# Patient Record
Sex: Female | Born: 1995 | Race: Black or African American | Hispanic: No | Marital: Single | Smoking: Former smoker
Health system: Southern US, Community
[De-identification: ages and names within clinical notes are randomized; demographics above are authoritative.]

## PROBLEM LIST (undated history)

## (undated) ENCOUNTER — Inpatient Hospital Stay (HOSPITAL_COMMUNITY): Payer: Self-pay

## (undated) DIAGNOSIS — G8929 Other chronic pain: Secondary | ICD-10-CM

## (undated) DIAGNOSIS — J45909 Unspecified asthma, uncomplicated: Secondary | ICD-10-CM

## (undated) DIAGNOSIS — Z302 Encounter for sterilization: Secondary | ICD-10-CM

## (undated) DIAGNOSIS — K6289 Other specified diseases of anus and rectum: Secondary | ICD-10-CM

## (undated) DIAGNOSIS — B999 Unspecified infectious disease: Secondary | ICD-10-CM

## (undated) DIAGNOSIS — G43909 Migraine, unspecified, not intractable, without status migrainosus: Secondary | ICD-10-CM

## (undated) DIAGNOSIS — Z789 Other specified health status: Secondary | ICD-10-CM

## (undated) DIAGNOSIS — Z9851 Tubal ligation status: Secondary | ICD-10-CM

## (undated) HISTORY — PX: TUBAL LIGATION: SHX77

## (undated) HISTORY — PX: DILATION AND CURETTAGE OF UTERUS: SHX78

## (undated) HISTORY — PX: TOOTH EXTRACTION: SUR596

## (undated) HISTORY — PX: NO PAST SURGERIES: SHX2092

---
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
## 2014-03-17 ENCOUNTER — Emergency Department (HOSPITAL_COMMUNITY)
Admission: EM | Admit: 2014-03-17 | Discharge: 2014-03-18 | Disposition: A | Discharge status: Home / Self Care | Payer: Medicaid Other | Attending: Emergency Medicine | Admitting: Emergency Medicine

## 2014-03-17 DIAGNOSIS — F17200 Nicotine dependence, unspecified, uncomplicated: Secondary | ICD-10-CM | POA: Insufficient documentation

## 2014-03-17 DIAGNOSIS — Z3202 Encounter for pregnancy test, result negative: Secondary | ICD-10-CM | POA: Insufficient documentation

## 2014-03-17 DIAGNOSIS — G43909 Migraine, unspecified, not intractable, without status migrainosus: Secondary | ICD-10-CM | POA: Insufficient documentation

## 2014-03-17 HISTORY — DX: Migraine, unspecified, not intractable, without status migrainosus: G43.909

## 2014-03-18 ENCOUNTER — Encounter (HOSPITAL_COMMUNITY): Payer: Self-pay | Admitting: Emergency Medicine

## 2014-03-18 LAB — PREGNANCY, URINE: Preg Test, Ur: NEGATIVE

## 2014-03-18 MED ORDER — DIPHENHYDRAMINE HCL 25 MG PO CAPS
25.0000 mg | ORAL_CAPSULE | Freq: Once | ORAL | Status: AC
  Administered 2014-03-18: 25 mg via ORAL
  Filled 2014-03-18: qty 1

## 2014-03-18 MED ORDER — KETOROLAC TROMETHAMINE 60 MG/2ML IM SOLN
60.0000 mg | Freq: Once | INTRAMUSCULAR | Status: AC
  Administered 2014-03-18: 60 mg via INTRAMUSCULAR
  Filled 2014-03-18: qty 2

## 2014-03-18 MED ORDER — METOCLOPRAMIDE HCL 5 MG/ML IJ SOLN
10.0000 mg | Freq: Once | INTRAMUSCULAR | Status: AC
  Administered 2014-03-18: 10 mg via INTRAMUSCULAR
  Filled 2014-03-18: qty 2

## 2014-03-18 MED ORDER — METOCLOPRAMIDE HCL 10 MG PO TABS: 10.0000 mg | ORAL_TABLET | Freq: Four times a day (QID) | ORAL | Status: DC

## 2014-03-18 NOTE — ED Notes (Signed)
Headache for 2 days above eyes,  States not a migraine and denies nausea or vision problems only states eyes are sore.  Pt is alert and oriented in NAD

## 2014-03-18 NOTE — ED Provider Notes (Signed)
CSN: [id number]     Arrival date & time 03/17/14  2352 History   First MD Initiated Contact with Patient 03/18/14 0014     Chief Complaint  Patient presents with  . Headache     (Consider location/radiation/quality/duration/timing/severity/associated sxs/prior Treatment) HPI Comments: 18 year old female with migraine history and smoking history presents with gradual onset headache for the past 2 days. I reviewed the nurses note and clarified with the patient this is slightly different than her migraines however overall has many similarities. The headache is moved from bilateral frontal behind the eyes left-sided. No injuries or history of CNS problems. No fevers or neck stiffness. No other neuro symptoms. Patient feels well otherwise. Patient tried 200 mg of ibuprofen with only mild improvement. Patient feels likely the heat and allergy symptoms have brought on her symptoms.  Patient is a 18 y.o. female presenting with headaches. The history is provided by the patient.  Headache Associated symptoms: photophobia   Associated symptoms: no abdominal pain, no back pain, no fever, no neck pain, no neck stiffness, no numbness and no vomiting     Past Medical History  Diagnosis Date  . No pertinent past medical history   . Migraines    Past Surgical History  Procedure Laterality Date  . No past surgeries     Family History  Problem Relation Age of Onset  . Hypertension Mother   . Hypertension Father   . Diabetes Maternal Aunt   . Hypertension Maternal Aunt   . Diabetes Maternal Uncle   . Cancer Maternal Grandmother   . Hypertension Maternal Grandmother   . Cancer Maternal Grandfather     colon  . Hypertension Maternal Grandfather    History  Substance Use Topics  . Smoking status: Current Every Day Smoker  . Smokeless tobacco: Never Used  . Alcohol Use: No   OB History   Grav Para Term Preterm Abortions TAB SAB Ect Mult Living   1 1 1  0 0 0 0 0 0 1     Review of Systems   Constitutional: Negative for fever and chills.  Eyes: Positive for photophobia. Negative for visual disturbance.  Respiratory: Negative for shortness of breath.   Cardiovascular: Negative for chest pain.  Gastrointestinal: Negative for vomiting and abdominal pain.  Genitourinary: Negative for dysuria and flank pain.  Musculoskeletal: Negative for back pain, neck pain and neck stiffness.  Skin: Negative for rash.  Neurological: Positive for headaches. Negative for weakness, light-headedness and numbness.      Allergies  Penicillins cross reactors and Pineapple  Home Medications   Prior to Admission medications   Not on File   BP 142/85  Pulse 84  Temp(Src) 98.6 F (37 C) (Oral)  Resp 16  SpO2 99% Physical Exam  Nursing note and vitals reviewed. Constitutional: She is oriented to person, place, and time. She appears well-developed and well-nourished.  HENT:  Head: Normocephalic and atraumatic.  Eyes: Conjunctivae are normal. Right eye exhibits no discharge. Left eye exhibits no discharge.  Neck: Normal range of motion. Neck supple. No tracheal deviation present.  Cardiovascular: Normal rate.   Pulmonary/Chest: Effort normal.  Abdominal: Soft.  Musculoskeletal: She exhibits no edema.  Neurological: She is alert and oriented to person, place, and time. Coordination normal. GCS eye subscore is 4. GCS verbal subscore is 5. GCS motor subscore is 6.  5+ strength in UE and LE with f/e at major joints. Sensation to palpation intact in UE and LE. CNs 2-12 grossly intact.  EOMFI.  PERRL.   Finger nose and coordination intact bilateral.   Visual fields intact to finger testing.   Skin: Skin is warm. No rash noted.  Psychiatric: She has a normal mood and affect.    ED Course  Procedures (including critical care time) Labs Review Labs Reviewed  PREGNANCY, URINE    Imaging Review No results found.   EKG Interpretation None      MDM   Final diagnoses:  Migraine    Well-appearing female with mild symptoms overall similar to her previous migraine. Plan for oral fluids, migraine cocktail and urine pregnancy test. The headache is gradual onset, no signs of meningitis and no significant medical history. On recheck patient well-appearing, headache improved significantly.   Results and differential diagnosis were discussed with the patient. Close follow up outpatient was discussed, patient comfortable with the plan.   Filed Vitals:   03/17/14 2357  BP: 142/85  Pulse: 84  Temp: 98.6 F (37 C)  TempSrc: Oral  Resp: 16  SpO2: 99%         Mariea Clonts, MD 03/18/14 [phone]

## 2014-03-18 NOTE — Discharge Instructions (Signed)
For headaches that are similar to migraines she can try Reglan with oral Benadryl. Stay well-hydrated and followup with a local doctor headaches get worse or more recurrent. Return to the ER for headaches that are sudden onset, severe at onset, fevers associated or other neuro symptoms.   Headaches, Frequently Asked Questions MIGRAINE HEADACHES Q: What is migraine? What causes it? How can I treat it? A: Generally, migraine headaches begin as a dull ache. Then they develop into a constant, throbbing, and pulsating pain. You may experience pain at the temples. You may experience pain at the front or back of one or both sides of the head. The pain is usually accompanied by a combination of:  Nausea.  Vomiting.  Sensitivity to light and noise. Some people (about 15%) experience an aura (see below) before an attack. The cause of migraine is believed to be chemical reactions in the brain. Treatment for migraine may include over-the-counter or prescription medications. It may also include self-help techniques. These include relaxation training and biofeedback.  Q: What is an aura? A: About 15% of people with migraine get an "aura". This is a sign of neurological symptoms that occur before a migraine headache. You may see wavy or jagged lines, dots, or flashing lights. You might experience tunnel vision or blind spots in one or both eyes. The aura can include visual or auditory hallucinations (something imagined). It may include disruptions in smell (such as strange odors), taste or touch. Other symptoms include:  Numbness.  A "pins and needles" sensation.  Difficulty in recalling or speaking the correct word. These neurological events may last as long as 60 minutes. These symptoms will fade as the headache begins. Q: What is a trigger? A: Certain physical or environmental factors can lead to or "trigger" a migraine. These include:  Foods.  Hormonal changes.  Weather.  Stress. It is  important to remember that triggers are different for everyone. To help prevent migraine attacks, you need to figure out which triggers affect you. Keep a headache diary. This is a good way to track triggers. The diary will help you talk to your healthcare professional about your condition. Q: Does weather affect migraines? A: Bright sunshine, hot, humid conditions, and drastic changes in barometric pressure may lead to, or "trigger," a migraine attack in some people. But studies have shown that weather does not act as a trigger for everyone with migraines. Q: What is the link between migraine and hormones? A: Hormones start and regulate many of your body's functions. Hormones keep your body in balance within a constantly changing environment. The levels of hormones in your body are unbalanced at times. Examples are during menstruation, pregnancy, or menopause. That can lead to a migraine attack. In fact, about three quarters of all women with migraine report that their attacks are related to the menstrual cycle.  Q: Is there an increased risk of stroke for migraine sufferers? A: The likelihood of a migraine attack causing a stroke is very remote. That is not to say that migraine sufferers cannot have a stroke associated with their migraines. In persons under age 24, the most common associated factor for stroke is migraine headache. But over the course of a person's normal life span, the occurrence of migraine headache may actually be associated with a reduced risk of dying from cerebrovascular disease due to stroke.  Q: What are acute medications for migraine? A: Acute medications are used to treat the pain of the headache after it has started. Examples  over-the-counter medications, NSAIDs, ergots, and triptans.  Q: What are the triptans? A: Triptans are the newest class of abortive medications. They are specifically targeted to treat migraine. Triptans are vasoconstrictors. They moderate some chemical  reactions in the brain. The triptans work on receptors in your brain. Triptans help to restore the balance of a neurotransmitter called serotonin. Fluctuations in levels of serotonin are thought to be a main cause of migraine.  Q: Are over-the-counter medications for migraine effective? A: Over-the-counter, or "OTC," medications may be effective in relieving mild to moderate pain and associated symptoms of migraine. But you should see your caregiver before beginning any treatment regimen for migraine.  Q: What are preventive medications for migraine? A: Preventive medications for migraine are sometimes referred to as "prophylactic" treatments. They are used to reduce the frequency, severity, and length of migraine attacks. Examples of preventive medications include antiepileptic medications, antidepressants, beta-blockers, calcium channel blockers, and NSAIDs (nonsteroidal anti-inflammatory drugs). Q: Why are anticonvulsants used to treat migraine? A: During the past few years, there has been an increased interest in antiepileptic drugs for the prevention of migraine. They are sometimes referred to as "anticonvulsants". Both epilepsy and migraine may be caused by similar reactions in the brain.  Q: Why are antidepressants used to treat migraine? A: Antidepressants are typically used to treat people with depression. They may reduce migraine frequency by regulating chemical levels, such as serotonin, in the brain.  Q: What alternative therapies are used to treat migraine? A: The term "alternative therapies" is often used to describe treatments considered outside the scope of conventional Western medicine. Examples of alternative therapy include acupuncture, acupressure, and yoga. Another common alternative treatment is herbal therapy. Some herbs are believed to relieve headache pain. Always discuss alternative therapies with your caregiver before proceeding. Some herbal products contain arsenic and other  toxins. TENSION HEADACHES Q: What is a tension-type headache? What causes it? How can I treat it? A: Tension-type headaches occur randomly. They are often the result of temporary stress, anxiety, fatigue, or anger. Symptoms include soreness in your temples, a tightening band-like sensation around your head (a "vice-like" ache). Symptoms can also include a pulling feeling, pressure sensations, and contracting head and neck muscles. The headache begins in your forehead, temples, or the back of your head and neck. Treatment for tension-type headache may include over-the-counter or prescription medications. Treatment may also include self-help techniques such as relaxation training and biofeedback. CLUSTER HEADACHES Q: What is a cluster headache? What causes it? How can I treat it? A: Cluster headache gets its name because the attacks come in groups. The pain arrives with little, if any, warning. It is usually on one side of the head. A tearing or bloodshot eye and a runny nose on the same side of the headache may also accompany the pain. Cluster headaches are believed to be caused by chemical reactions in the brain. They have been described as the most severe and intense of any headache type. Treatment for cluster headache includes prescription medication and oxygen. SINUS HEADACHES Q: What is a sinus headache? What causes it? How can I treat it? A: When a cavity in the bones of the face and skull (a sinus) becomes inflamed, the inflammation will cause localized pain. This condition is usually the result of an allergic reaction, a tumor, or an infection. If your headache is caused by a sinus blockage, such as an infection, you will probably have a fever. An x-ray will confirm a sinus blockage. Your caregiver's  treatment might include antibiotics for the infection, as well as antihistamines or decongestants.  REBOUND HEADACHES Q: What is a rebound headache? What causes it? How can I treat it? A: A pattern of  taking acute headache medications too often can lead to a condition known as "rebound headache." A pattern of taking too much headache medication includes taking it more than 2 days per week or in excessive amounts. That means more than the label or a caregiver advises. With rebound headaches, your medications not only stop relieving pain, they actually begin to cause headaches. Doctors treat rebound headache by tapering the medication that is being overused. Sometimes your caregiver will gradually substitute a different type of treatment or medication. Stopping may be a challenge. Regularly overusing a medication increases the potential for serious side effects. Consult a caregiver if you regularly use headache medications more than 2 days per week or more than the label advises. ADDITIONAL QUESTIONS AND ANSWERS Q: What is biofeedback? A: Biofeedback is a self-help treatment. Biofeedback uses special equipment to monitor your body's involuntary physical responses. Biofeedback monitors:  Breathing.  Pulse.  Heart rate.  Temperature.  Muscle tension.  Brain activity. Biofeedback helps you refine and perfect your relaxation exercises. You learn to control the physical responses that are related to stress. Once the technique has been mastered, you do not need the equipment any more. Q: Are headaches hereditary? A: Four out of five (80%) of people that suffer report a family history of migraine. Scientists are not sure if this is genetic or a family predisposition. Despite the uncertainty, a child has a 50% chance of having migraine if one parent suffers. The child has a 75% chance if both parents suffer.  Q: Can children get headaches? A: By the time they reach high school, most young people have experienced some type of headache. Many safe and effective approaches or medications can prevent a headache from occurring or stop it after it has begun.  Q: What type of doctor should I see to diagnose  and treat my headache? A: Start with your primary caregiver. Discuss his or her experience and approach to headaches. Discuss methods of classification, diagnosis, and treatment. Your caregiver may decide to recommend you to a headache specialist, depending upon your symptoms or other physical conditions. Having diabetes, allergies, etc., may require a more comprehensive and inclusive approach to your headache. The National Headache Foundation will provide, upon request, a list of [facility] physician members in your state. Document Released: 01/16/2004 Document Revised: 01/18/2012 Document Reviewed: 06/25/2008 [facility] Patient Information 2014 [place].

## 2014-03-18 NOTE — ED Notes (Signed)
Patient is alert and oriented x3.  She is complaining of a headache that has lasted for 2 days. Patient states that she does have a history of migraines but this doesn't feel like one.  Currently  She rates her pain 8 of 10 without nausea.

## 2014-07-13 ENCOUNTER — Encounter (HOSPITAL_COMMUNITY): Payer: Self-pay | Admitting: Emergency Medicine

## 2014-07-13 ENCOUNTER — Emergency Department (INDEPENDENT_AMBULATORY_CARE_PROVIDER_SITE_OTHER)
Admission: EM | Admit: 2014-07-13 | Discharge: 2014-07-13 | Disposition: A | Discharge status: Home / Self Care | Payer: Medicaid Other | Source: Home / Self Care | Attending: Emergency Medicine | Admitting: Emergency Medicine

## 2014-07-13 DIAGNOSIS — Z3201 Encounter for pregnancy test, result positive: Secondary | ICD-10-CM

## 2014-07-13 DIAGNOSIS — J300 Vasomotor rhinitis: Secondary | ICD-10-CM

## 2014-07-13 DIAGNOSIS — Z3491 Encounter for supervision of normal pregnancy, unspecified, first trimester: Secondary | ICD-10-CM

## 2014-07-13 DIAGNOSIS — J309 Allergic rhinitis, unspecified: Secondary | ICD-10-CM

## 2014-07-13 LAB — POCT PREGNANCY, URINE: Preg Test, Ur: POSITIVE — AB

## 2014-07-13 MED ORDER — PRENATAL VITAMINS PLUS 27-1 MG PO TABS: ORAL_TABLET | ORAL | Status: DC

## 2014-07-13 NOTE — Discharge Instructions (Signed)
May use antihistamines (Allegra, Zyrtec, Claritin), saline nose spray, inhale steam, moist, warm compresses to face.  First Trimester of Pregnancy The first trimester of pregnancy is from week 1 until the end of week 12 (months 1 through 3). A week after a sperm fertilizes an egg, the egg will implant on the wall of the uterus. This embryo will begin to develop into a baby. Genes from you and your partner are forming the baby. The female genes determine whether the baby is a boy or a girl. At 6-8 weeks, the eyes and face are formed, and the heartbeat can be seen on ultrasound. At the end of 12 weeks, all the baby's organs are formed.  Now that you are pregnant, you will want to do everything you can to have a healthy baby. Two of the most important things are to get good prenatal care and to follow your health care provider's instructions. Prenatal care is all the medical care you receive before the baby's birth. This care will help prevent, find, and treat any problems during the pregnancy and childbirth. BODY CHANGES Your body goes through many changes during pregnancy. The changes vary from woman to woman.   You may gain or lose a couple of pounds at first.  You may feel sick to your stomach (nauseous) and throw up (vomit). If the vomiting is uncontrollable, call your health care provider.  You may tire easily.  You may develop headaches that can be relieved by medicines approved by your health care provider.  You may urinate more often. Painful urination may mean you have a bladder infection.  You may develop heartburn as a result of your pregnancy.  You may develop constipation because certain hormones are causing the muscles that push waste through your intestines to slow down.  You may develop hemorrhoids or swollen, bulging veins (varicose veins).  Your breasts may begin to grow larger and become tender. Your nipples may stick out more, and the tissue that surrounds them (areola) may  become darker.  Your gums may bleed and may be sensitive to brushing and flossing.  Dark spots or blotches (chloasma, mask of pregnancy) may develop on your face. This will likely fade after the baby is born.  Your menstrual periods will stop.  You may have a loss of appetite.  You may develop cravings for certain kinds of food.  You may have changes in your emotions from day to day, such as being excited to be pregnant or being concerned that something may go wrong with the pregnancy and baby.  You may have more vivid and strange dreams.  You may have changes in your hair. These can include thickening of your hair, rapid growth, and changes in texture. Some women also have hair loss during or after pregnancy, or hair that feels dry or thin. Your hair will most likely return to normal after your baby is born. WHAT TO EXPECT AT YOUR PRENATAL VISITS During a routine prenatal visit:  You will be weighed to make sure you and the baby are growing normally.  Your blood pressure will be taken.  Your abdomen will be measured to track your baby's growth.  The fetal heartbeat will be listened to starting around week 10 or 12 of your pregnancy.  Test results from any previous visits will be discussed. Your health care provider may ask you:  How you are feeling.  If you are feeling the baby move.  If you have had any abnormal symptoms, such  as leaking fluid, bleeding, severe headaches, or abdominal cramping.  If you have any questions. Other tests that may be performed during your first trimester include:  Blood tests to find your blood type and to check for the presence of any previous infections. They will also be used to check for low iron levels (anemia) and Rh antibodies. Later in the pregnancy, blood tests for diabetes will be done along with other tests if problems develop.  Urine tests to check for infections, diabetes, or protein in the urine.  An ultrasound to confirm the  proper growth and development of the baby.  An amniocentesis to check for possible genetic problems.  Fetal screens for spina bifida and Down syndrome.  You may need other tests to make sure you and the baby are doing well. HOME CARE INSTRUCTIONS  Medicines  Follow your health care provider's instructions regarding medicine use. Specific medicines may be either safe or unsafe to take during pregnancy.  Take your prenatal vitamins as directed.  If you develop constipation, try taking a stool softener if your health care provider approves. Diet  Eat regular, well-balanced meals. Choose a variety of foods, such as meat or vegetable-based protein, fish, milk and low-fat dairy products, vegetables, fruits, and whole grain breads and cereals. Your health care provider will help you determine the amount of weight gain that is right for you.  Avoid raw meat and uncooked cheese. These carry germs that can cause birth defects in the baby.  Eating four or five small meals rather than three large meals a day may help relieve nausea and vomiting. If you start to feel nauseous, eating a few soda crackers can be helpful. Drinking liquids between meals instead of during meals also seems to help nausea and vomiting.  If you develop constipation, eat more high-fiber foods, such as fresh vegetables or fruit and whole grains. Drink enough fluids to keep your urine clear or pale yellow. Activity and Exercise  Exercise only as directed by your health care provider. Exercising will help you:  Control your weight.  Stay in shape.  Be prepared for labor and delivery.  Experiencing pain or cramping in the lower abdomen or low back is a good sign that you should stop exercising. Check with your health care provider before continuing normal exercises.  Try to avoid standing for long periods of time. Move your legs often if you must stand in one place for a long time.  Avoid heavy lifting.  Wear  low-heeled shoes, and practice good posture.  You may continue to have sex unless your health care provider directs you otherwise. Relief of Pain or Discomfort  Wear a good support bra for breast tenderness.   Take warm sitz baths to soothe any pain or discomfort caused by hemorrhoids. Use hemorrhoid cream if your health care provider approves.   Rest with your legs elevated if you have leg cramps or low back pain.  If you develop varicose veins in your legs, wear support hose. Elevate your feet for 15 minutes, 3-4 times a day. Limit salt in your diet. Prenatal Care  Schedule your prenatal visits by the twelfth week of pregnancy. They are usually scheduled monthly at first, then more often in the last 2 months before delivery.  Write down your questions. Take them to your prenatal visits.  Keep all your prenatal visits as directed by your health care provider. Safety  Wear your seat belt at all times when driving.  Make a list of  emergency phone numbers, including numbers for family, friends, the hospital, and police and fire departments. General Tips  Ask your health care provider for a referral to a local prenatal education class. Begin classes no later than at the beginning of month 6 of your pregnancy.  Ask for help if you have counseling or nutritional needs during pregnancy. Your health care provider can offer advice or refer you to specialists for help with various needs.  Do not use hot tubs, steam rooms, or saunas.  Do not douche or use tampons or scented sanitary pads.  Do not cross your legs for long periods of time.  Avoid cat litter boxes and soil used by cats. These carry germs that can cause birth defects in the baby and possibly loss of the fetus by miscarriage or stillbirth.  Avoid all smoking, herbs, alcohol, and medicines not prescribed by your health care provider. Chemicals in these affect the formation and growth of the baby.  Schedule a dentist  appointment. At home, brush your teeth with a soft toothbrush and be gentle when you floss. SEEK MEDICAL CARE IF:   You have dizziness.  You have mild pelvic cramps, pelvic pressure, or nagging pain in the abdominal area.  You have persistent nausea, vomiting, or diarrhea.  You have a bad smelling vaginal discharge.  You have pain with urination.  You notice increased swelling in your face, hands, legs, or ankles. SEEK IMMEDIATE MEDICAL CARE IF:   You have a fever.  You are leaking fluid from your vagina.  You have spotting or bleeding from your vagina.  You have severe abdominal cramping or pain.  You have rapid weight gain or loss.  You vomit blood or material that looks like coffee grounds.  You are exposed to [place] measles and have never had them.  You are exposed to fifth disease or chickenpox.  You develop a severe headache.  You have shortness of breath.  You have any kind of trauma, such as from a fall or a car accident. Document Released: 10/20/2001 Document Revised: 03/12/2014 Document Reviewed: 09/05/2013 [facility] Patient Information 2015 [place], [place]. This information is not intended to replace advice given to you by your health care provider. Make sure you discuss any questions you have with your health care provider.

## 2014-07-13 NOTE — ED Notes (Signed)
C/o URI type symptoms since last week. No relief w OTC medications

## 2014-07-13 NOTE — ED Provider Notes (Signed)
  Chief Complaint   Chief Complaint  Patient presents with  . URI    History of Present Illness   Raven Roberts is a 18 year old female who has had a one-week history of nasal congestion with clear rhinorrhea, sneezing, watery eyes, and slight headache. She has a history of allergies in the past. She denies any fever, chills, earache, sore throat, or cough. The patient's last menstrual period was August 7. She had a pregnancy test done yesterday at home which was positive. This would be her second pregnancy.  Review of Systems   Other than as noted above, the patient denies any of the following symptoms: Systemic:  No fevers or chills. Eye:  No redness, pain, discharge, itching, blurred vision, or diplopia. ENT:  No headache, nasal congestion, sneezing, itching, epistaxis, ear pain, decreased hearing, ringing in ears, vertigo, or tinnitus.  No oral lesions, sore throat, or hoarseness. Neck:  No neck pain or adenopathy. Skin:  No rash or itching.  [place]   Past medical history, family history, social history, meds, and allergies were reviewed. She is allergic to penicillin and amoxicillin.  Physical Examination     Vital signs:  BP 128/95  Pulse 66  Temp(Src) 98.5 F (36.9 C) (Oral)  SpO2 100% General:  Alert and oriented.  In no distress.  Skin warm and dry. Eye:  PERRL, full EOMs, lids and conjunctiva normal.   ENT:  TMs and canals clear.  Nasal mucosa was moderately congested and erythematous without drainage.  Mucous membranes moist, no oral lesions, normal dentition, pharynx clear.  No cranial or facial pain to palplation. There is no pain or swelling over the mastoid. Neck:  Supple, full ROM.  No adenopathy, tenderness or mass.  Thyroid normal. Lungs:  Breath sounds clear and equal bilaterally.  No wheezes, rales or rhonchi. Heart:  Rhythm regular, without extrasystoles.  No gallops or murmers. Skin:  Clear, warm and dry.  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 Diagnoses of Allergic rhinitis, unspecified allergic rhinitis type and First trimester pregnancy were also pertinent to this visit.  Suspect vasomotor rhinitis secondary to pregnancy along with a component of allergic rhinitis as well. There is no evidence of sinusitis. No indication for antibiotics. Unfortunately since she is pregnant she cannot take any Decongestants or steroid. Suggested plain antihistamines, saline nasal spray, elevate the head of the bed, inhaling steam, and warm compresses to the face. Followup with gynecologist as soon as possible for prenatal care. Provided with prescription for prenatal vitamins.  Plan    1.  Meds:  The following meds were prescribed:   New Prescriptions   PRENATAL VIT-FE FUMARATE-FA (PRENATAL VITAMINS PLUS) 27-1 MG TABS    Take 1 daily    2.  Patient Education/Counseling:  The patient was given appropriate handouts, self care instructions, and instructed in symptomatic relief.    3.  Follow up:  The patient was told to follow up here if no better in 3 to 4 days, or sooner if becoming worse in any way, and given some red flag symptoms such as fever or purulent nasal drainage which would prompt immediate return.       Harden Mo, MD 07/13/14 [phone]

## 2014-08-06 LAB — OB RESULTS CONSOLE ABO/RH: RH Type: POSITIVE

## 2014-08-06 LAB — OB RESULTS CONSOLE HIV ANTIBODY (ROUTINE TESTING): HIV: NONREACTIVE

## 2014-08-06 LAB — OB RESULTS CONSOLE ANTIBODY SCREEN: Antibody Screen: NEGATIVE

## 2014-08-06 LAB — OB RESULTS CONSOLE GC/CHLAMYDIA
Chlamydia: NEGATIVE
Gonorrhea: NEGATIVE

## 2014-08-06 LAB — OB RESULTS CONSOLE RPR
RPR: NONREACTIVE
RPR: NONREACTIVE

## 2014-08-06 LAB — OB RESULTS CONSOLE HEPATITIS B SURFACE ANTIGEN: Hepatitis B Surface Ag: NEGATIVE

## 2014-08-06 LAB — OB RESULTS CONSOLE RUBELLA ANTIBODY, IGM: Rubella: NON-IMMUNE/NOT IMMUNE

## 2014-08-07 ENCOUNTER — Inpatient Hospital Stay (HOSPITAL_COMMUNITY): Payer: Medicaid Other

## 2014-08-07 ENCOUNTER — Encounter (HOSPITAL_COMMUNITY): Payer: Self-pay | Admitting: *Deleted

## 2014-08-07 ENCOUNTER — Inpatient Hospital Stay (HOSPITAL_COMMUNITY)
Admission: AD | Admit: 2014-08-07 | Discharge: 2014-08-07 | Disposition: A | Discharge status: Home / Self Care | Payer: Medicaid Other | Source: Ambulatory Visit | Attending: Obstetrics and Gynecology | Admitting: Obstetrics and Gynecology

## 2014-08-07 DIAGNOSIS — B9689 Other specified bacterial agents as the cause of diseases classified elsewhere: Secondary | ICD-10-CM | POA: Diagnosis not present

## 2014-08-07 DIAGNOSIS — Z87891 Personal history of nicotine dependence: Secondary | ICD-10-CM | POA: Insufficient documentation

## 2014-08-07 DIAGNOSIS — O2390 Unspecified genitourinary tract infection in pregnancy, unspecified trimester: Secondary | ICD-10-CM | POA: Diagnosis not present

## 2014-08-07 DIAGNOSIS — O209 Hemorrhage in early pregnancy, unspecified: Secondary | ICD-10-CM | POA: Diagnosis present

## 2014-08-07 DIAGNOSIS — O3410 Maternal care for benign tumor of corpus uteri, unspecified trimester: Secondary | ICD-10-CM | POA: Diagnosis not present

## 2014-08-07 DIAGNOSIS — N760 Acute vaginitis: Secondary | ICD-10-CM | POA: Insufficient documentation

## 2014-08-07 DIAGNOSIS — A499 Bacterial infection, unspecified: Secondary | ICD-10-CM | POA: Insufficient documentation

## 2014-08-07 DIAGNOSIS — N390 Urinary tract infection, site not specified: Secondary | ICD-10-CM | POA: Diagnosis not present

## 2014-08-07 DIAGNOSIS — D251 Intramural leiomyoma of uterus: Secondary | ICD-10-CM | POA: Insufficient documentation

## 2014-08-07 HISTORY — DX: Other specified diseases of anus and rectum: K62.89

## 2014-08-07 HISTORY — DX: Other chronic pain: G89.29

## 2014-08-07 HISTORY — DX: Unspecified infectious disease: B99.9

## 2014-08-07 HISTORY — DX: Unspecified asthma, uncomplicated: J45.909

## 2014-08-07 LAB — CBC WITH DIFFERENTIAL/PLATELET
Basophils Absolute: 0 10*3/uL (ref 0.0–0.1)
Basophils Relative: 0 % (ref 0–1)
EOS PCT: 2 % (ref 0–5)
Eosinophils Absolute: 0.1 10*3/uL (ref 0.0–0.7)
HEMATOCRIT: 38.1 % (ref 36.0–46.0)
Hemoglobin: 12.9 g/dL (ref 12.0–15.0)
LYMPHS ABS: 1.9 10*3/uL (ref 0.7–4.0)
LYMPHS PCT: 22 % (ref 12–46)
MCH: 29.1 pg (ref 26.0–34.0)
MCHC: 33.9 g/dL (ref 30.0–36.0)
MCV: 86 fL (ref 78.0–100.0)
MONO ABS: 0.5 10*3/uL (ref 0.1–1.0)
MONOS PCT: 6 % (ref 3–12)
Neutro Abs: 5.9 10*3/uL (ref 1.7–7.7)
Neutrophils Relative %: 70 % (ref 43–77)
Platelets: 192 10*3/uL (ref 150–400)
RBC: 4.43 MIL/uL (ref 3.87–5.11)
RDW: 13.1 % (ref 11.5–15.5)
WBC: 8.5 10*3/uL (ref 4.0–10.5)

## 2014-08-07 LAB — WET PREP, GENITAL
Trich, Wet Prep: NONE SEEN
Yeast Wet Prep HPF POC: NONE SEEN

## 2014-08-07 MED ORDER — NITROFURANTOIN MONOHYD MACRO 100 MG PO CAPS: 100.0000 mg | ORAL_CAPSULE | Freq: Two times a day (BID) | ORAL | Status: AC

## 2014-08-07 MED ORDER — METRONIDAZOLE 500 MG PO TABS: 500.0000 mg | ORAL_TABLET | Freq: Two times a day (BID) | ORAL | Status: DC

## 2014-08-07 NOTE — MAU Note (Signed)
Urine in lab 

## 2014-08-07 NOTE — Discharge Instructions (Signed)
Asymptomatic Bacteriuria Asymptomatic bacteriuria is the presence of a large number of bacteria in your urine without the usual symptoms of burning or frequent urination. The following conditions increase the risk of asymptomatic bacteriuria:  Diabetes mellitus.  Advanced age.  Pregnancy in the first trimester.  Kidney stones.  Kidney transplants.  Leaky kidney tube valve in young children (reflux). Treatment for this condition is not needed in most people and can lead to other problems such as too much yeast and growth of resistant bacteria. However, some people, such as pregnant women, do need treatment to prevent kidney infection. Asymptomatic bacteriuria in pregnancy is also associated with fetal growth restriction, premature labor, and newborn death. HOME CARE INSTRUCTIONS Monitor your condition for any changes. The following actions may help to relieve any discomfort you are feeling:  Drink enough water and fluids to keep your urine clear or pale yellow. Go to the bathroom more often to keep your bladder empty.  Keep the area around your vagina and rectum clean. Wipe yourself from front to back after urinating. SEEK IMMEDIATE MEDICAL CARE IF:  You develop signs of an infection such as:  Burning with urination.  Frequency of voiding.  Back pain.  Fever.  You have blood in the urine.  You develop a fever. MAKE SURE YOU:  Understand these instructions.  Will watch your condition.  Will get help right away if you are not doing well or get worse. Document Released: 10/26/2005 Document Revised: 03/12/2014 Document Reviewed: 04/17/2013 [facility] Patient Information 2015 [place], [place]. This information is not intended to replace advice given to you by your health care provider. Make sure you discuss any questions you have with your health care provider. Bacterial Vaginosis Bacterial vaginosis is a vaginal infection that occurs when the normal balance of bacteria in the  vagina is disrupted. It results from an overgrowth of certain bacteria. This is the most common vaginal infection in women of childbearing age. Treatment is important to prevent complications, especially in pregnant women, as it can cause a premature delivery. CAUSES  Bacterial vaginosis is caused by an increase in harmful bacteria that are normally present in smaller amounts in the vagina. Several different kinds of bacteria can cause bacterial vaginosis. However, the reason that the condition develops is not fully understood. RISK FACTORS Certain activities or behaviors can put you at an increased risk of developing bacterial vaginosis, including:  Having a new sex partner or multiple sex partners.  Douching.  Using an intrauterine device (IUD) for contraception. Women do not get bacterial vaginosis from toilet seats, bedding, swimming pools, or contact with objects around them. SIGNS AND SYMPTOMS  Some women with bacterial vaginosis have no signs or symptoms. Common symptoms include:  Grey vaginal discharge.  A fishlike odor with discharge, especially after sexual intercourse.  Itching or burning of the vagina and vulva.  Burning or pain with urination. DIAGNOSIS  Your health care provider will take a medical history and examine the vagina for signs of bacterial vaginosis. A sample of vaginal fluid may be taken. Your health care provider will look at this sample under a microscope to check for bacteria and abnormal cells. A vaginal pH test may also be done.  TREATMENT  Bacterial vaginosis may be treated with antibiotic medicines. These may be given in the form of a pill or a vaginal cream. A second round of antibiotics may be prescribed if the condition comes back after treatment.  HOME CARE INSTRUCTIONS   Only take over-the-counter or prescription  medicines as directed by your health care provider.  If antibiotic medicine was prescribed, take it as directed. Make sure you finish it  even if you start to feel better.  Do not have sex until treatment is completed.  Tell all sexual partners that you have a vaginal infection. They should see their health care provider and be treated if they have problems, such as a mild rash or itching.  Practice safe sex by using condoms and only having one sex partner. SEEK MEDICAL CARE IF:   Your symptoms are not improving after 3 days of treatment.  You have increased discharge or pain.  You have a fever. MAKE SURE YOU:   Understand these instructions.  Will watch your condition.  Will get help right away if you are not doing well or get worse. FOR MORE INFORMATION  Centers for Disease Control and Prevention, Division of STD Prevention: AppraiserFraud[employer] American Sexual Health Association (ASHA): www.ashastd.org  Document Released: 10/26/2005 Document Revised: 08/16/2013 Document Reviewed: 06/07/2013 [facility] Patient Information 2015 [place], [place]. This information is not intended to replace advice given to you by your health care provider. Make sure you discuss any questions you have with your health care provider.

## 2014-08-07 NOTE — Progress Notes (Signed)
V. Standard CNM in to discuss test results and d/c plan. Ok to talk in front of visitor. Written and verbal d/c instructions given and understanding voiced.

## 2014-08-07 NOTE — MAU Note (Signed)
Pt called and not in lobby 

## 2014-08-07 NOTE — Progress Notes (Signed)
Pink noted on swabs but no bright blood.

## 2014-08-07 NOTE — MAU Provider Note (Signed)
ANEESHA HOLLORAN is a 18 y.o. G2P1001 at 8.3 weeks c/o bright red blood this morning when she went to the bathroom.  She denies abd cramping but c/o rectal pain   History     Patient Active Problem List   Diagnosis Date Noted  . Active labor 07/04/2012  . SVD (spontaneous vaginal delivery) 07/04/2012    Chief Complaint  Patient presents with  . Vaginal Bleeding   HPI  OB History   Grav Para Term Preterm Abortions TAB SAB Ect Mult Living   2 1 1  0 0 0 0 0 0 1      Past Medical History  Diagnosis Date  . No pertinent past medical history   . Migraines   . Asthma   . Infection     uti, trich    Past Surgical History  Procedure Laterality Date  . No past surgeries      Family History  Problem Relation Age of Onset  . Hypertension Mother   . Hypertension Father   . Diabetes Maternal Aunt   . Hypertension Maternal Aunt   . Diabetes Maternal Uncle   . Cancer Maternal Grandmother   . Hypertension Maternal Grandmother   . Cancer Maternal Grandfather     colon  . Hypertension Maternal Grandfather   . Stroke Maternal Grandfather     History  Substance Use Topics  . Smoking status: Former Research scientist (life sciences)  . Smokeless tobacco: Never Used     Comment: quit with + preg  . Alcohol Use: No    Allergies:  Allergies  Allergen Reactions  . Penicillins Cross Reactors Anaphylaxis  . Pineapple Shortness Of Breath and Swelling    Swelling of tongue    Prescriptions prior to admission  Medication Sig Dispense Refill  . metoCLOPramide (REGLAN) 10 MG tablet Take 1 tablet (10 mg total) by mouth every 6 (six) hours.  5 tablet  0  . Prenatal Vit-Fe Fumarate-FA (PRENATAL VITAMINS PLUS) 27-1 MG TABS Take 1 daily  30 tablet  12    ROS See HPI above, all other systems are negative  Physical Exam   Blood pressure 156/87, pulse 80, temperature 98.3 F (36.8 C), temperature source Oral, resp. rate 20, height 5' 4.5" (1.638 m), weight 132.224 kg (291 lb 8 oz), unknown if currently  breastfeeding.  Physical Exam Ext:  WNL ABD: Soft, non tender to palpation, no rebound or guarding SVE: FP   ED Course  Assessment: IUP at  8.3weeks by dates   Plan:  Labs: CBC, wetprep, GC/CT OB US for viability  Django Nguyen, CNM, MSN 08/07/2014. 10:46 AM

## 2014-08-07 NOTE — MAU Provider Note (Signed)
MAU Addendum Note  Results for orders placed during the hospital encounter of 08/07/14 (from the past 24 hour(s))  CBC WITH DIFFERENTIAL     Status: None   Collection Time    08/07/14 11:00 AM      Result Value Ref Range   WBC 8.5  4.0 - 10.5 K/uL   RBC 4.43  3.87 - 5.11 MIL/uL   Hemoglobin 12.9  12.0 - 15.0 g/dL   HCT 38.1  36.0 - 46.0 %   MCV 86.0  78.0 - 100.0 fL   MCH 29.1  26.0 - 34.0 pg   MCHC 33.9  30.0 - 36.0 g/dL   RDW 13.1  11.5 - 15.5 %   Platelets 192  150 - 400 K/uL   Neutrophils Relative % 70  43 - 77 %   Neutro Abs 5.9  1.7 - 7.7 K/uL   Lymphocytes Relative 22  12 - 46 %   Lymphs Abs 1.9  0.7 - 4.0 K/uL   Monocytes Relative 6  3 - 12 %   Monocytes Absolute 0.5  0.1 - 1.0 K/uL   Eosinophils Relative 2  0 - 5 %   Eosinophils Absolute 0.1  0.0 - 0.7 K/uL   Basophils Relative 0  0 - 1 %   Basophils Absolute 0.0  0.0 - 0.1 K/uL  WET PREP, GENITAL     Status: Abnormal   Collection Time    08/07/14 12:00 PM      Result Value Ref Range   Yeast Wet Prep HPF POC NONE SEEN  NONE SEEN   Trich, Wet Prep NONE SEEN  NONE SEEN   Clue Cells Wet Prep HPF POC FEW (*) NONE SEEN   WBC, Wet Prep HPF POC MANY (*) NONE SEEN   [place] results FINDINGS:  Intrauterine gestational sac: Visualized/normal in shape.  Yolk sac: Visualized  Embryo: Visualized  Cardiac Activity: Visualized  Heart Rate: 137 bpm  MSD: mm w d  CRL: 17 mm 8 w 2 d [place] EDC: 03/17/2015  Maternal uterus/adnexae: No subchorionic hemorrhage. Ovaries  symmetric in size and echotexture. No free fluid.  Posterior intramural fibroid noted measuring up to 3.9 cm  IMPRESSION:  Eight week 2 day intrauterine pregnancy with fetal heart rate 137  beats per min. No acute maternal findings.  Posterior 3.9 cm fibroid.  BV+ UTI  Consulted with Dr. Charlesetta Garibaldi DC to home with Rx FU in the office on next [place], CNM, MSN 08/07/2014. 1:13 PM

## 2014-08-07 NOTE — MAU Note (Signed)
Saw blood when wiped this morning, x1. Bright red, no clots. Denies recent intercourse.

## 2014-08-08 LAB — GC/CHLAMYDIA PROBE AMP
CT PROBE, AMP APTIMA: NEGATIVE
GC PROBE AMP APTIMA: NEGATIVE

## 2014-09-10 ENCOUNTER — Encounter (HOSPITAL_COMMUNITY): Payer: Self-pay | Admitting: *Deleted

## 2014-10-27 ENCOUNTER — Inpatient Hospital Stay (HOSPITAL_COMMUNITY)
Admission: AD | Admit: 2014-10-27 | Discharge: 2014-10-28 | Disposition: A | Discharge status: Home / Self Care | Payer: Medicaid Other | Source: Ambulatory Visit | Attending: Obstetrics and Gynecology | Admitting: Obstetrics and Gynecology

## 2014-10-27 ENCOUNTER — Encounter (HOSPITAL_COMMUNITY): Payer: Self-pay | Admitting: *Deleted

## 2014-10-27 DIAGNOSIS — Z3A20 20 weeks gestation of pregnancy: Secondary | ICD-10-CM | POA: Diagnosis not present

## 2014-10-27 DIAGNOSIS — B3731 Acute candidiasis of vulva and vagina: Secondary | ICD-10-CM

## 2014-10-27 DIAGNOSIS — N898 Other specified noninflammatory disorders of vagina: Secondary | ICD-10-CM | POA: Diagnosis present

## 2014-10-27 DIAGNOSIS — O9989 Other specified diseases and conditions complicating pregnancy, childbirth and the puerperium: Secondary | ICD-10-CM | POA: Insufficient documentation

## 2014-10-27 DIAGNOSIS — Z87891 Personal history of nicotine dependence: Secondary | ICD-10-CM | POA: Insufficient documentation

## 2014-10-27 DIAGNOSIS — B373 Candidiasis of vulva and vagina: Secondary | ICD-10-CM

## 2014-10-27 LAB — WET PREP, GENITAL
Clue Cells Wet Prep HPF POC: NONE SEEN
TRICH WET PREP: NONE SEEN

## 2014-10-27 NOTE — MAU Provider Note (Signed)
History    Raven Roberts is a 18y.o. G2P1001 at 20 wks who presents, unannounced, for vaginal discharge.  Patient reports discharge thick, white, and causing itching.  Patient states has been occuring for last week and was seen on Monday, but did not express complaint.  Patient denies VB, LOF, and reports fetal movement.  Reports no sexual intercourse in more than a month.  Denies issues with urination, constipation, diarrhea.  No N/V.    Patient Active Problem List   Diagnosis Date Noted  . Vaginal discharge during pregnancy in second trimester 10/27/2014  . Active labor 07/04/2012  . SVD (spontaneous vaginal delivery) 07/04/2012    Chief Complaint  Patient presents with  . Vaginal Discharge   HPI  OB History    Gravida Para Term Preterm AB TAB SAB Ectopic Multiple Living   2 1 1  0 0 0 0 0 0 1      Past Medical History  Diagnosis Date  . No pertinent past medical history   . Migraines   . Asthma   . Infection     uti, trich  . Rectal pain, chronic     Past Surgical History  Procedure Laterality Date  . No past surgeries      Family History  Problem Relation Age of Onset  . Hypertension Mother   . Hypertension Father   . Diabetes Maternal Aunt   . Hypertension Maternal Aunt   . Diabetes Maternal Uncle   . Cancer Maternal Grandmother   . Hypertension Maternal Grandmother   . Cancer Maternal Grandfather     colon  . Hypertension Maternal Grandfather   . Stroke Maternal Grandfather     History  Substance Use Topics  . Smoking status: Former Research scientist (life sciences)  . Smokeless tobacco: Never Used     Comment: quit with + preg  . Alcohol Use: No    Allergies:  Allergies  Allergen Reactions  . Penicillins Cross Reactors Anaphylaxis  . Pineapple Shortness Of Breath and Swelling    Swelling of tongue    Prescriptions prior to admission  Medication Sig Dispense Refill Last Dose  . diphenhydramine-acetaminophen (TYLENOL PM) 25-500 MG TABS Take 1 tablet by mouth at  bedtime as needed.   Past Week at Unknown time  . ibuprofen (ADVIL,MOTRIN) 800 MG tablet Take 800 mg by mouth every 8 (eight) hours as needed.     . Prenatal Vit-Fe Fumarate-FA (PRENATAL VITAMINS PLUS) 27-1 MG TABS Take 1 daily 30 tablet 12 10/26/2014 at Unknown time  . albuterol (PROVENTIL HFA;VENTOLIN HFA) 108 (90 BASE) MCG/ACT inhaler Inhale 2 puffs into the lungs every 6 (six) hours as needed for wheezing or shortness of breath.   More than a month at Unknown time  . metroNIDAZOLE (FLAGYL) 500 MG tablet Take 1 tablet (500 mg total) by mouth 2 (two) times daily. 14 tablet 0     ROS  See HPI Above Physical Exam   Blood pressure 153/78, pulse 80, temperature 98.9 F (37.2 C), resp. rate 18, height 5' 4.5" (1.638 m), weight 301 lb 3.2 oz (136.623 kg), unknown if currently breastfeeding. Results for orders placed or performed during the hospital encounter of 10/27/14 (from the past 24 hour(s))  Wet prep, genital     Status: Abnormal   Collection Time: 10/27/14 11:20 PM  Result Value Ref Range   Yeast Wet Prep HPF POC FEW (A) NONE SEEN   Trich, Wet Prep NONE SEEN NONE SEEN   Clue Cells Wet Prep HPF POC  NONE SEEN NONE SEEN   WBC, Wet Prep HPF POC MODERATE (A) NONE SEEN  Urinalysis, Routine w reflex microscopic     Status: Abnormal   Collection Time: 10/27/14 11:25 PM  Result Value Ref Range   Color, Urine YELLOW YELLOW   APPearance CLOUDY (A) CLEAR   Specific Gravity, Urine 1.025 1.005 - 1.030   pH 6.5 5.0 - 8.0   Glucose, UA NEGATIVE NEGATIVE mg/dL   Hgb urine dipstick NEGATIVE NEGATIVE   Bilirubin Urine NEGATIVE NEGATIVE   Ketones, ur 15 (A) NEGATIVE mg/dL   Protein, ur NEGATIVE NEGATIVE mg/dL   Urobilinogen, UA 0.2 0.0 - 1.0 mg/dL   Nitrite NEGATIVE NEGATIVE   Leukocytes, UA SMALL (A) NEGATIVE  Urine microscopic-add on     Status: Abnormal   Collection Time: 10/27/14 11:25 PM  Result Value Ref Range   Squamous Epithelial / LPF MANY (A) RARE   WBC, UA 3-6 <3 WBC/hpf   RBC / HPF  0-2 <3 RBC/hpf   Bacteria, UA FEW (A) RARE   Urine-Other AMORPHOUS URATES/PHOSPHATES      Physical Exam  Constitutional: She is oriented to person, place, and time.  Cardiovascular: Normal rate, regular rhythm and normal heart sounds.   Respiratory: Effort normal and breath sounds normal.  GI: Soft. Bowel sounds are normal.  Genitourinary: There is tenderness on the right labia. There is tenderness on the left labia. Cervix exhibits discharge. Cervix exhibits no motion tenderness. Vaginal discharge found.  Speculum Exam: External: Thin white discharge on labia.  Redness and irritation noted on labia -Vaginal Vault: Thin white discharge noted-wet prep collected -Cervix:  Thick yellowish mucoid discharge from os-GC/CT collected Bimanual Exam: Closed/Long/Thick/Ballotable  Musculoskeletal: Normal range of motion.  Neurological: She is alert and oriented to person, place, and time.  Skin: Skin is warm and dry.    ED Course  Assessment: IUP at [facility] Vaginal Discharge H/O MJ Usage  Plan: -PE as above -Labs Pending: Wet Prep, Gc/CT, UA, Drug screen   Follow Up (0014) -Wet prep: + Yeast -UA; Bacteria/Urates noted---Culture Sent -All other labs pending -Nurse instructed to: -Inform patient of infection and Rx to pharmacy -Bleeding precautions -Follow up in office as scheduled -Patient discharged to home in stable condition  Tashema Tiller LYNN CNM, MSN 10/27/2014 11:13 PM

## 2014-10-27 NOTE — MAU Note (Signed)
Think I have yeast infection. Having white, thick vag d/c with itching that has been present for a wk.

## 2014-10-27 NOTE — Progress Notes (Signed)
Gavin Pound CNM on unit

## 2014-10-28 LAB — URINALYSIS, ROUTINE W REFLEX MICROSCOPIC
Bilirubin Urine: NEGATIVE
GLUCOSE, UA: NEGATIVE mg/dL
Hgb urine dipstick: NEGATIVE
KETONES UR: 15 mg/dL — AB
Nitrite: NEGATIVE
PH: 6.5 (ref 5.0–8.0)
Protein, ur: NEGATIVE mg/dL
Specific Gravity, Urine: 1.025 (ref 1.005–1.030)
Urobilinogen, UA: 0.2 mg/dL (ref 0.0–1.0)

## 2014-10-28 LAB — URINE MICROSCOPIC-ADD ON

## 2014-10-28 MED ORDER — TERCONAZOLE 0.4 % VA CREA: 1.0000 | TOPICAL_CREAM | Freq: Every day | VAGINAL | Status: DC

## 2014-10-28 NOTE — Progress Notes (Signed)
Written and verbal d/c instructions given and understanding voiced. 

## 2014-10-28 NOTE — Discharge Instructions (Signed)
Candidal Vulvovaginitis Candidal vulvovaginitis is an infection of the vagina and vulva. The vulva is the skin around the opening of the vagina. This may cause itching and discomfort in and around the vagina.  HOME CARE  Only take medicine as told by your doctor.  Do not have sex (intercourse) until the infection is healed or as told by your doctor.  Practice safe sex.  Tell your sex partner about your infection.  Do not douche or use tampons.  Wear cotton underwear. Do not wear tight pants or panty hose.  Eat yogurt. This may help treat and prevent yeast infections. GET HELP RIGHT AWAY IF:   You have a fever.  Your problems get worse during treatment or do not get better in 3 days.  You have discomfort, irritation, or itching in your vagina or vulva area.  You have pain after sex.  You start to get belly (abdominal) pain. MAKE SURE YOU:  Understand these instructions.  Will watch your condition.  Will get help right away if you are not doing well or get worse. Document Released: 01/22/2009 Document Revised: 10/31/2013 Document Reviewed: 01/22/2009 [facility] Patient Information 2015 [place], [place]. This information is not intended to replace advice given to you by your health care provider. Make sure you discuss any questions you have with your health care provider. Second Trimester of Pregnancy The second trimester is from week 13 through week 28, month 4 through 6. This is often the time in pregnancy that you feel your best. Often times, morning sickness has lessened or quit. You may have more energy, and you may get hungry more often. Your unborn baby (fetus) is growing rapidly. At the end of the sixth month, he or she is about 9 inches long and weighs about 1 pounds. You will likely feel the baby move (quickening) between 18 and 20 weeks of pregnancy. HOME CARE   Avoid all smoking, herbs, and alcohol. Avoid drugs not approved by your doctor.  Only take medicine as told  by your doctor. Some medicines are safe and some are not during pregnancy.  Exercise only as told by your doctor. Stop exercising if you start having cramps.  Eat regular, healthy meals.  Wear a good support bra if your breasts are tender.  Do not use hot tubs, steam rooms, or saunas.  Wear your seat belt when driving.  Avoid raw meat, uncooked cheese, and liter boxes and soil used by cats.  Take your prenatal vitamins.  Try taking medicine that helps you poop (stool softener) as needed, and if your doctor approves. Eat more fiber by eating fresh fruit, vegetables, and whole grains. Drink enough fluids to keep your pee (urine) clear or pale yellow.  Take warm water baths (sitz baths) to soothe pain or discomfort caused by hemorrhoids. Use hemorrhoid cream if your doctor approves.  If you have puffy, bulging veins (varicose veins), wear support hose. Raise (elevate) your feet for 15 minutes, 3-4 times a day. Limit salt in your diet.  Avoid heavy lifting, wear low heals, and sit up straight.  Rest with your legs raised if you have leg cramps or low back pain.  Visit your dentist if you have not gone during your pregnancy. Use a soft toothbrush to brush your teeth. Be gentle when you floss.  You can have sex (intercourse) unless your doctor tells you not to.  Go to your doctor visits. GET HELP IF:   You feel dizzy.  You have mild cramps or pressure  in your lower belly (abdomen).  You have a nagging pain in your belly area.  You continue to feel sick to your stomach (nauseous), throw up (vomit), or have watery poop (diarrhea).  You have bad smelling fluid coming from your vagina.  You have pain with peeing (urination). GET HELP RIGHT AWAY IF:   You have a fever.  You are leaking fluid from your vagina.  You have spotting or bleeding from your vagina.  You have severe belly cramping or pain.  You lose or gain weight rapidly.  You have trouble catching your breath  and have chest pain.  You notice sudden or extreme puffiness (swelling) of your face, hands, ankles, feet, or legs.  You have not felt the baby move in over an hour.  You have severe headaches that do not go away with medicine.  You have vision changes. Document Released: 01/20/2010 Document Revised: 02/20/2013 Document Reviewed: 12/27/2012 [facility] Patient Information 2015 [place], [place]. This information is not intended to replace advice given to you by your health care provider. Make sure you discuss any questions you have with your health care provider.

## 2014-10-28 NOTE — Progress Notes (Signed)
Gavin Pound CNM notified of pt's test results. Will d/c home

## 2014-10-29 LAB — CULTURE, OB URINE
COLONY COUNT: NO GROWTH
Culture: NO GROWTH

## 2014-10-29 LAB — GC/CHLAMYDIA PROBE AMP
CT Probe RNA: NEGATIVE
GC PROBE AMP APTIMA: NEGATIVE

## 2014-10-30 LAB — DRUGS OF ABUSE SCREEN W/O ALC, ROUTINE URINE
Amphetamine Screen, Ur: NEGATIVE
BARBITURATE QUANT UR: NEGATIVE
BENZODIAZEPINES.: NEGATIVE
COCAINE METABOLITES: NEGATIVE
Creatinine,U: 202.5 mg/dL
METHADONE: NEGATIVE
Marijuana Metabolite: NEGATIVE
Opiate Screen, Urine: NEGATIVE
PHENCYCLIDINE (PCP): NEGATIVE
Propoxyphene: NEGATIVE

## 2014-11-09 NOTE — L&D Delivery Note (Signed)
Delivery Note At 10:03 AM a viable female, "[name]", was delivered via Vaginal, Spontaneous Delivery (Presentation: ; Occiput Anterior).  APGAR: , ; weight 6 lb 9.8 oz (3000 g).   Placenta status: Intact, Spontaneous.  Cord: 3 vessels with the following complications: CAN x 2.  Cord pH: Pending  Baby with depressed respiratory and cardiac function at delivery--CAN x 2, delivered via somersault maneuver, reduced after delivery.  NICU team called, with baby responding well to stimuation.  Left in room with mother by Nursery Team.  Filed Vitals:   02/28/15 0818 02/28/15 0946 02/28/15 0947 02/28/15 0958  BP: 122/90  156/97 157/48  Pulse: 113  113 144  Temp: 98.8 F (37.1 C) 99.2 F (37.3 C)    TempSrc: Oral Oral    Resp: 20     Height: 5\' 4"  (1.626 m)     Weight: 140.615 kg (310 lb)      10 u pitocin IM given after delivery of placenta.  Baby with foul odor at delivery.  Patient had received Nubain 20 mg [place] at 0914.  Maternal temp 99.2 just prior to delivery.  Anesthesia: None  Episiotomy: None Lacerations: 1st degree perineal Suture Repair: 3.0 vicryl Est. Blood Loss (mL):    Mom to postpartum.  Baby to Couplet care / Skin to Skin. Patient plans Micronor for contraception. Placenta to path. Will inform nursery/peds about mild fetal renal pyelectasis. Will observe maternal vital signs.  Donnel Saxon 02/28/2015, 10:42 AM

## 2014-11-20 LAB — US OB DETAIL + 14 WK

## 2014-12-03 ENCOUNTER — Ambulatory Visit: Payer: Medicaid Other | Admitting: Neurology

## 2014-12-06 ENCOUNTER — Ambulatory Visit: Payer: Self-pay | Admitting: Neurology

## 2014-12-11 ENCOUNTER — Encounter: Payer: Self-pay | Admitting: Neurology

## 2014-12-11 ENCOUNTER — Ambulatory Visit (INDEPENDENT_AMBULATORY_CARE_PROVIDER_SITE_OTHER): Payer: Medicaid Other | Admitting: Neurology

## 2014-12-11 VITALS — BP 129/85 | HR 76 | Ht 64.5 in | Wt 298.0 lb

## 2014-12-11 DIAGNOSIS — G43009 Migraine without aura, not intractable, without status migrainosus: Secondary | ICD-10-CM

## 2014-12-11 MED ORDER — BUTALBITAL-APAP-CAFFEINE 50-325-40 MG PO TABS: 1.0000 | ORAL_TABLET | Freq: Four times a day (QID) | ORAL | Status: DC | PRN

## 2014-12-11 NOTE — Progress Notes (Signed)
PATIENT: Raven Roberts DOB: [birth year]  HISTORICAL  Arsema D Adami is a 19 yo right-handed African-American female, referred by her obstetrician Dr. Raphael Gibney for evaluation of frequent headaches  She has history of migraine headaches since middle school, getting worse over the past 6 months, she is currently [redacted] weeks pregnant  Her typical migraine are left retro-orbital area severe pounding headaches with associated light, noise sensitivity, Lasting for Hours, to Few Days, Sleeping Always Helps, She Has Tried Over-The-Counter Ibuprofen, Tylenol, without Helping, she used to have headaches twice a month, but over past 6 months, she has 2-3 headaches each week, trigger for her headaches are stress, weather change, sleep deprivation.  She denies visual loss, no lateralized motor or sensory deficit.  REVIEW OF SYSTEMS: Full 14 system review of systems performed and notable only for as above,  ALLERGIES: Allergies  Allergen Reactions  . Penicillins Cross Reactors Anaphylaxis  . Pineapple Shortness Of Breath and Swelling    Swelling of tongue    HOME MEDICATIONS: Current Outpatient Prescriptions  Medication Sig Dispense Refill  . albuterol (PROVENTIL HFA;VENTOLIN HFA) 108 (90 BASE) MCG/ACT inhaler Inhale 2 puffs into the lungs every 6 (six) hours as needed for wheezing or shortness of breath.    . diphenhydramine-acetaminophen (TYLENOL PM) 25-500 MG TABS Take 1 tablet by mouth at bedtime as needed.    Marland Kitchen ibuprofen (ADVIL,MOTRIN) 800 MG tablet Take 800 mg by mouth every 8 (eight) hours as needed.    . Prenatal Vit-Fe Fumarate-FA (PRENATAL VITAMINS PLUS) 27-1 MG TABS Take 1 daily 30 tablet 12  . terconazole (TERAZOL 7) 0.4 % vaginal cream Place 1 applicator vaginally at bedtime. 45 g 0   No current facility-administered medications for this visit.    PAST MEDICAL HISTORY: Past Medical History  Diagnosis Date  . No pertinent past medical history   . Migraines   . Asthma   .  Infection     uti, trich  . Rectal pain, chronic     PAST SURGICAL HISTORY: Past Surgical History  Procedure Laterality Date  . No past surgeries      FAMILY HISTORY: Family History  Problem Relation Age of Onset  . Hypertension Mother   . Hypertension Father   . Diabetes Maternal Aunt   . Hypertension Maternal Aunt   . Diabetes Maternal Uncle   . Cancer Maternal Grandmother   . Hypertension Maternal Grandmother   . Cancer Maternal Grandfather     colon  . Hypertension Maternal Grandfather   . Stroke Maternal Grandfather     SOCIAL HISTORY:  History   Social History  . Marital Status: Single    Spouse Name: N/A    Number of Children: 1  . Years of Education: N/A   Occupational History  . Not on file.   Social History Main Topics  . Smoking status: Former Research scientist (life sciences)  . Smokeless tobacco: Never Used     Comment: quit with + preg  . Alcohol Use: No  . Drug Use: No  . Sexual Activity: Yes    Birth Control/ Protection: None   Other Topics Concern  . Not on file   Social History Narrative     PHYSICAL EXAM   There were no vitals filed for this visit.  Not recorded      There is no weight on file to calculate BMI.   Generalized: In no acute distress  Neck: Supple, no carotid bruits   Cardiac: Regular rate rhythm  Pulmonary: Clear  to auscultation bilaterally  Musculoskeletal: No deformity  Neurological examination  Mentation: Alert oriented to time, place, history taking, and causual conversation  Cranial nerve II-XII: Pupils were equal round reactive to light. Extraocular movements were full.  Visual field were full on confrontational test. Bilateral fundi were sharp.  Facial sensation and strength were normal. Hearing was intact to finger rubbing bilaterally. Uvula tongue midline.  Head turning and shoulder shrug and were normal and symmetric.Tongue protrusion into cheek strength was normal.  Motor: Normal tone, bulk and strength.  Sensory:  Intact to fine touch, pinprick, preserved vibratory sensation, and proprioception at toes.  Coordination: Normal finger to nose, heel-to-shin bilaterally there was no truncal ataxia  Gait: Rising up from seated position without assistance, normal stance, without trunk ataxia, moderate stride, good arm swing, smooth turning, able to perform tiptoe, and heel walking without difficulty.   Romberg signs: Negative  Deep tendon reflexes: Brachioradialis 2/2, biceps 2/2, triceps 2/2, patellar 2/2, Achilles 2/2, plantar responses were flexor bilaterally.   DIAGNOSTIC DATA (LABS, IMAGING, TESTING) - I reviewed patient records, labs, notes, testing and imaging myself where available.  Lab Results  Component Value Date   WBC 8.5 08/07/2014   HGB 12.9 08/07/2014   HCT 38.1 08/07/2014   MCV 86.0 08/07/2014   PLT 192 08/07/2014      Component Value Date/Time   NA 135 07/03/2012 1310   K 4.3 07/03/2012 1310   CL 101 07/03/2012 1310   CO2 21 07/03/2012 1310   GLUCOSE 88 07/03/2012 1310   BUN 8 07/03/2012 1310   CREATININE 0.51 07/03/2012 1310   CALCIUM 9.5 07/03/2012 1310   PROT 6.3 07/03/2012 1310   ALBUMIN 2.5* 07/03/2012 1310   AST 12 07/03/2012 1310   ALT 8 07/03/2012 1310   ALKPHOS 218* 07/03/2012 1310   BILITOT 0.2* 07/03/2012 1310   GFRNONAA NOT CALCULATED 07/03/2012 1310   GFRAA NOT CALCULATED 07/03/2012 1310   No results found for: CHOL, HDL, LDLCALC, LDLDIRECT, TRIG, CHOLHDL No results found for: HGBA1C No results found for: VITAMINB12 No results found for: TSH    ASSESSMENT AND PLAN  Akeela D Warzecha is a 19 y.o. female currently [redacted] weeks pregnant, presenting with frequent migraine headaches, normal neurological examinations.  1, try magnesium oxide, riboflavin as migraine prevention 2, Fioricet as needed, limit the use less than 3 times each week 3, return to clinic in 2-3 months  No orders of the defined types were placed in this encounter.    Return in about 3  months (around 03/11/2015). Marcial Pacas, M.D. Ph.D.  [facility] [address], [place], [place] [address] [phone]

## 2014-12-11 NOTE — Patient Instructions (Signed)
Magnesium oxide 400 mg twice a day Riboflavin 100 mg twice a day  Fioricet as needed, limit the use to less than 2-3 times each week  Return to clinic in 1-2 month

## 2014-12-13 ENCOUNTER — Encounter: Payer: Self-pay | Admitting: Neurology

## 2014-12-19 ENCOUNTER — Other Ambulatory Visit (HOSPITAL_COMMUNITY): Payer: Self-pay | Admitting: Family Medicine

## 2014-12-19 DIAGNOSIS — Z0489 Encounter for examination and observation for other specified reasons: Secondary | ICD-10-CM

## 2014-12-19 DIAGNOSIS — IMO0002 Reserved for concepts with insufficient information to code with codable children: Secondary | ICD-10-CM

## 2014-12-27 ENCOUNTER — Ambulatory Visit (HOSPITAL_COMMUNITY): Payer: Medicaid Other

## 2014-12-31 ENCOUNTER — Encounter (HOSPITAL_COMMUNITY): Payer: Self-pay

## 2014-12-31 ENCOUNTER — Ambulatory Visit (HOSPITAL_COMMUNITY)
Admission: RE | Admit: 2014-12-31 | Discharge: 2014-12-31 | Disposition: A | Discharge status: Home / Self Care | Payer: Medicaid Other | Source: Ambulatory Visit | Attending: Family Medicine | Admitting: Family Medicine

## 2014-12-31 DIAGNOSIS — Z0489 Encounter for examination and observation for other specified reasons: Secondary | ICD-10-CM

## 2014-12-31 DIAGNOSIS — IMO0002 Reserved for concepts with insufficient information to code with codable children: Secondary | ICD-10-CM

## 2014-12-31 DIAGNOSIS — O99213 Obesity complicating pregnancy, third trimester: Secondary | ICD-10-CM | POA: Diagnosis not present

## 2014-12-31 DIAGNOSIS — Z3A29 29 weeks gestation of pregnancy: Secondary | ICD-10-CM | POA: Insufficient documentation

## 2014-12-31 DIAGNOSIS — O99210 Obesity complicating pregnancy, unspecified trimester: Secondary | ICD-10-CM

## 2014-12-31 DIAGNOSIS — O358XX0 Maternal care for other (suspected) fetal abnormality and damage, not applicable or unspecified: Secondary | ICD-10-CM | POA: Insufficient documentation

## 2014-12-31 DIAGNOSIS — Z36 Encounter for antenatal screening of mother: Secondary | ICD-10-CM | POA: Insufficient documentation

## 2015-01-01 ENCOUNTER — Other Ambulatory Visit (HOSPITAL_COMMUNITY): Payer: Self-pay

## 2015-01-01 ENCOUNTER — Encounter (HOSPITAL_COMMUNITY): Payer: Self-pay

## 2015-01-07 ENCOUNTER — Encounter: Payer: Self-pay | Admitting: Obstetrics and Gynecology

## 2015-01-28 ENCOUNTER — Inpatient Hospital Stay (HOSPITAL_COMMUNITY)
Admission: AD | Admit: 2015-01-28 | Discharge: 2015-01-29 | Disposition: A | Discharge status: Home / Self Care | Payer: Medicaid Other | Source: Ambulatory Visit | Attending: Obstetrics and Gynecology | Admitting: Obstetrics and Gynecology

## 2015-01-28 DIAGNOSIS — R109 Unspecified abdominal pain: Secondary | ICD-10-CM | POA: Insufficient documentation

## 2015-01-28 DIAGNOSIS — Z87891 Personal history of nicotine dependence: Secondary | ICD-10-CM | POA: Insufficient documentation

## 2015-01-28 DIAGNOSIS — O9989 Other specified diseases and conditions complicating pregnancy, childbirth and the puerperium: Secondary | ICD-10-CM | POA: Insufficient documentation

## 2015-01-28 DIAGNOSIS — Z3A29 29 weeks gestation of pregnancy: Secondary | ICD-10-CM | POA: Insufficient documentation

## 2015-01-28 DIAGNOSIS — O99213 Obesity complicating pregnancy, third trimester: Secondary | ICD-10-CM | POA: Insufficient documentation

## 2015-01-28 DIAGNOSIS — M549 Dorsalgia, unspecified: Secondary | ICD-10-CM | POA: Insufficient documentation

## 2015-01-29 ENCOUNTER — Encounter (HOSPITAL_COMMUNITY): Payer: Self-pay | Admitting: *Deleted

## 2015-01-29 DIAGNOSIS — R109 Unspecified abdominal pain: Secondary | ICD-10-CM | POA: Diagnosis present

## 2015-01-29 DIAGNOSIS — Z3A29 29 weeks gestation of pregnancy: Secondary | ICD-10-CM | POA: Diagnosis not present

## 2015-01-29 DIAGNOSIS — O99213 Obesity complicating pregnancy, third trimester: Secondary | ICD-10-CM | POA: Diagnosis not present

## 2015-01-29 DIAGNOSIS — M549 Dorsalgia, unspecified: Secondary | ICD-10-CM | POA: Diagnosis not present

## 2015-01-29 DIAGNOSIS — Z87891 Personal history of nicotine dependence: Secondary | ICD-10-CM | POA: Diagnosis not present

## 2015-01-29 DIAGNOSIS — O9989 Other specified diseases and conditions complicating pregnancy, childbirth and the puerperium: Secondary | ICD-10-CM | POA: Diagnosis not present

## 2015-01-29 LAB — URINE MICROSCOPIC-ADD ON

## 2015-01-29 LAB — URINALYSIS, ROUTINE W REFLEX MICROSCOPIC
Bilirubin Urine: NEGATIVE
GLUCOSE, UA: NEGATIVE mg/dL
Hgb urine dipstick: NEGATIVE
KETONES UR: NEGATIVE mg/dL
Nitrite: NEGATIVE
PH: 7 (ref 5.0–8.0)
Protein, ur: NEGATIVE mg/dL
Specific Gravity, Urine: 1.025 (ref 1.005–1.030)
Urobilinogen, UA: 0.2 mg/dL (ref 0.0–1.0)

## 2015-01-29 NOTE — MAU Provider Note (Signed)
History    Raven Roberts is a 19 y.o. G2P1001 at 33.4wks who presents, unannounced, for back spasms and abdominal pain.  Patient reports pain began around 10pm and was intermittent until arrival.  Patient states at that time, pain stopped.  Patient reports pain as intermittent and denies issues with urination and bowel movements. Patient reports good fetal movement and denies LOF, VB, contractions, and change in vaginal discharge.  Patient reports infant is breech as of 32 week [place].   Patient Active Problem List   Diagnosis Date Noted  . [redacted] weeks gestation of pregnancy   . Maternal morbid obesity, antepartum   . Encounter for fetal anatomic survey   . Suspected fetal abnormality affecting management of mother   . Migraine without aura and without status migrainosus, not intractable 12/11/2014  . Vaginal discharge during pregnancy in second trimester 10/27/2014  . Active labor 07/04/2012  . SVD (spontaneous vaginal delivery) 07/04/2012    No chief complaint on file.  HPI  OB History    Gravida Para Term Preterm AB TAB SAB Ectopic Multiple Living   2 1 1  0 0 0 0 0 0 1      Past Medical History  Diagnosis Date  . No pertinent past medical history   . Migraines   . Asthma   . Infection     uti, trich  . Rectal pain, chronic     Past Surgical History  Procedure Laterality Date  . No past surgeries      Family History  Problem Relation Age of Onset  . Hypertension Mother   . Hypertension Father   . Diabetes Maternal Aunt   . Hypertension Maternal Aunt   . Diabetes Maternal Uncle   . Cancer Maternal Grandmother   . Hypertension Maternal Grandmother   . Cancer Maternal Grandfather     colon  . Hypertension Maternal Grandfather   . Stroke Maternal Grandfather     History  Substance Use Topics  . Smoking status: Former Research scientist (life sciences)  . Smokeless tobacco: Never Used     Comment: quit with + preg  . Alcohol Use: No    Allergies:  Allergies  Allergen Reactions  .  Penicillins Cross Reactors Anaphylaxis  . Pineapple Shortness Of Breath and Swelling    Swelling of tongue    Prescriptions prior to admission  Medication Sig Dispense Refill Last Dose  . butalbital-acetaminophen-caffeine (FIORICET, ESGIC) 50-325-40 MG per tablet Take 1 tablet by mouth every 6 (six) hours as needed for headache. (Patient not taking: Reported on 12/31/2014) 20 tablet 3 Not Taking  . diphenhydramine-acetaminophen (TYLENOL PM) 25-500 MG TABS Take 1 tablet by mouth at bedtime as needed.   Not Taking  . ibuprofen (ADVIL,MOTRIN) 800 MG tablet Take 800 mg by mouth every 8 (eight) hours as needed.   Not Taking  . Prenatal Vit-Fe Fumarate-FA (PRENATAL VITAMINS PLUS) 27-1 MG TABS Take 1 daily 30 tablet 12 Taking  . terconazole (TERAZOL 7) 0.4 % vaginal cream Place 1 applicator vaginally at bedtime. (Patient not taking: Reported on 12/31/2014) 45 g 0 Not Taking    ROS  See HPI Above Physical Exam   Blood pressure 134/81, pulse 86, temperature 98 F (36.7 C), temperature source Oral, resp. rate 20, height 5' 4.5" (1.638 m), weight 307 lb (139.254 kg), last menstrual period 06/08/2014, SpO2 100 %, unknown if currently breastfeeding.  No results found for this or any previous visit (from the past 24 hour(s)).  Physical Exam  Constitutional: She is  oriented to person, place, and time. Vital signs are normal. She appears well-developed.  Obese  HENT:  Head: Normocephalic and atraumatic.  Eyes: EOM are normal.  Neck: Normal range of motion.  Cardiovascular: Normal rate, regular rhythm and normal heart sounds.   Respiratory: Effort normal and breath sounds normal.  GI: Soft. Bowel sounds are normal.  Genitourinary:  Deferred  Musculoskeletal: Normal range of motion.  Neurological: She is alert and oriented to person, place, and time.  Skin: Skin is warm and dry.  Psychiatric: She has a normal mood and affect. Her behavior is normal.   FHR:125 bpm, Mod Var, -Decels, +Accels UC:   None graphed or palpated ED Course  Assessment: IUP at 33.4wks Cat I FT Back Pain Abdominal Pain  Plan: -PE as above -Labs: UA-Pending -Will monitor for pain return x 1 hour then discharge home  Follow Up (0209) -Patient reports pain is back, but feels like "baby is changing positions." -Patient educated on tylenol usage for pain and proper hydration--5-6 bottles daily -Discussed urine results and need for culture -Instructed to keep appt as scheduled for today at 1300 -Educated on usage of after hours phone  -Encouraged to call if any questions or concerns arise prior to next scheduled office visit. -Discharged to home in stable condition  Syann Cupples LYNN CNM, MSN 01/29/2015 12:44 AM

## 2015-01-29 NOTE — MAU Note (Signed)
Pt reports she has been having upper abd tightening for the last 2 hours. Denies dysuria. Reports good fetal movement.

## 2015-01-30 LAB — CULTURE, OB URINE: Colony Count: 25000

## 2015-02-27 ENCOUNTER — Encounter (HOSPITAL_COMMUNITY): Payer: Self-pay | Admitting: *Deleted

## 2015-02-27 ENCOUNTER — Inpatient Hospital Stay (HOSPITAL_COMMUNITY)
Admission: AD | Admit: 2015-02-27 | Discharge: 2015-02-27 | Disposition: A | Discharge status: Home / Self Care | Payer: Medicaid Other | Source: Ambulatory Visit | Attending: Obstetrics & Gynecology | Admitting: Obstetrics & Gynecology

## 2015-02-27 DIAGNOSIS — F1290 Cannabis use, unspecified, uncomplicated: Secondary | ICD-10-CM | POA: Diagnosis present

## 2015-02-27 DIAGNOSIS — Z833 Family history of diabetes mellitus: Secondary | ICD-10-CM

## 2015-02-27 DIAGNOSIS — Z87891 Personal history of nicotine dependence: Secondary | ICD-10-CM

## 2015-02-27 DIAGNOSIS — E669 Obesity, unspecified: Secondary | ICD-10-CM | POA: Insufficient documentation

## 2015-02-27 DIAGNOSIS — O9081 Anemia of the puerperium: Secondary | ICD-10-CM | POA: Diagnosis not present

## 2015-02-27 DIAGNOSIS — O358XX0 Maternal care for other (suspected) fetal abnormality and damage, not applicable or unspecified: Secondary | ICD-10-CM | POA: Diagnosis present

## 2015-02-27 DIAGNOSIS — O99213 Obesity complicating pregnancy, third trimester: Secondary | ICD-10-CM

## 2015-02-27 DIAGNOSIS — Z823 Family history of stroke: Secondary | ICD-10-CM

## 2015-02-27 DIAGNOSIS — Z3A37 37 weeks gestation of pregnancy: Secondary | ICD-10-CM

## 2015-02-27 DIAGNOSIS — O99214 Obesity complicating childbirth: Secondary | ICD-10-CM | POA: Diagnosis present

## 2015-02-27 DIAGNOSIS — O99324 Drug use complicating childbirth: Secondary | ICD-10-CM | POA: Diagnosis present

## 2015-02-27 DIAGNOSIS — D649 Anemia, unspecified: Secondary | ICD-10-CM | POA: Diagnosis not present

## 2015-02-27 DIAGNOSIS — Z8249 Family history of ischemic heart disease and other diseases of the circulatory system: Secondary | ICD-10-CM

## 2015-02-27 DIAGNOSIS — O9989 Other specified diseases and conditions complicating pregnancy, childbirth and the puerperium: Secondary | ICD-10-CM

## 2015-02-27 DIAGNOSIS — Z880 Allergy status to penicillin: Secondary | ICD-10-CM

## 2015-02-27 DIAGNOSIS — Z68.41 Body mass index (BMI) pediatric, 85th percentile to less than 95th percentile for age: Secondary | ICD-10-CM

## 2015-02-27 NOTE — MAU Note (Signed)
Pt reports she is having pressure and leaking some kind of yellow fluid all day long.

## 2015-02-27 NOTE — MAU Note (Signed)
PT SAYS  HER UNDERWEAR IS  WET -  AND FEELS   YELLOW  D/C  ON PERINEUM  -  STARTED ON Tuesday-  130PM.   VE -IN OFFICE  3 CM.    DENIES HSV AND MRSA.  GBS-  NEG

## 2015-02-27 NOTE — Discharge Instructions (Signed)
Braxton Hicks Contractions °Contractions of the uterus can occur throughout pregnancy. Contractions are not always a sign that you are in labor.  °WHAT ARE BRAXTON HICKS CONTRACTIONS?  °Contractions that occur before labor are called Braxton Hicks contractions, or false labor. Toward the end of pregnancy (32-34 weeks), these contractions can develop more often and may become more forceful. This is not true labor because these contractions do not result in opening (dilatation) and thinning of the cervix. They are sometimes difficult to tell apart from true labor because these contractions can be forceful and people have different pain tolerances. You should not feel embarrassed if you go to the hospital with false labor. Sometimes, the only way to tell if you are in true labor is for your health care provider to look for changes in the cervix. °If there are no prenatal problems or other health problems associated with the pregnancy, it is completely safe to be sent home with false labor and await the onset of true labor. °HOW CAN YOU TELL THE DIFFERENCE BETWEEN TRUE AND FALSE LABOR? °False Labor °· The contractions of false labor are usually shorter and not as hard as those of true labor.   °· The contractions are usually irregular.   °· The contractions are often felt in the front of the lower abdomen and in the groin.   °· The contractions may go away when you walk around or change positions while lying down.   °· The contractions get weaker and are shorter lasting as time goes on.   °· The contractions do not usually become progressively stronger, regular, and closer together as with true labor.   °True Labor °· Contractions in true labor last 30-70 seconds, become very regular, usually become more intense, and increase in frequency.   °· The contractions do not go away with walking.   °· The discomfort is usually felt in the top of the uterus and spreads to the lower abdomen and low back.   °· True labor can be  determined by your health care provider with an exam. This will show that the cervix is dilating and getting thinner.   °WHAT TO REMEMBER °· Keep up with your usual exercises and follow other instructions given by your health care provider.   °· Take medicines as directed by your health care provider.   °· Keep your regular prenatal appointments.   °· Eat and drink lightly if you think you are going into labor.   °· If Braxton Hicks contractions are making you uncomfortable:   °¨ Change your position from lying down or resting to walking, or from walking to resting.   °¨ Sit and rest in a tub of warm water.   °¨ Drink 2-3 glasses of water. Dehydration may cause these contractions.   °¨ Do slow and deep breathing several times an hour.   °WHEN SHOULD I SEEK IMMEDIATE MEDICAL CARE? °Seek immediate medical care if: °· Your contractions become stronger, more regular, and closer together.   °· You have fluid leaking or gushing from your vagina.   °· You have a fever.   °· You pass blood-tinged mucus.   °· You have vaginal bleeding.   °· You have continuous abdominal pain.   °· You have low back pain that you never had before.   °· You feel your baby's head pushing down and causing pelvic pressure.   °· Your baby is not moving as much as it used to.   °Document Released: 10/26/2005 Document Revised: 10/31/2013 Document Reviewed: 08/07/2013 °ExitCare® Patient Information ©2015 [employer]. This information is not intended to replace advice given to you by your health care   provider. Make sure you discuss any questions you have with your health care provider. ° °

## 2015-02-27 NOTE — MAU Provider Note (Signed)
  History  19 yo G2P1001 @ 37.5 wks presents to MAU after calling w/ c/o loss of mucus plug and possible LOF (yellow tinged) and occ ctxs since 1:30 PM yesterday. Reports +FM. Denies VB.  Patient Active Problem List   Diagnosis Date Noted  . False labor after 37 weeks of gestation without delivery 02/27/2015  . Maternal morbid obesity, antepartum   . Suspected fetal abnormality affecting management of mother   . Migraine without aura and without status migrainosus, not intractable 12/11/2014    No chief complaint on file.  HPI As above OB History    Gravida Para Term Preterm AB TAB SAB Ectopic Multiple Living   2 1 1  0 0 0 0 0 0 1      Past Medical History  Diagnosis Date  . No pertinent past medical history   . Migraines   . Asthma   . Infection     uti, trich  . Rectal pain, chronic     Past Surgical History  Procedure Laterality Date  . No past surgeries      Family History  Problem Relation Age of Onset  . Hypertension Mother   . Hypertension Father   . Diabetes Maternal Aunt   . Hypertension Maternal Aunt   . Diabetes Maternal Uncle   . Cancer Maternal Grandmother   . Hypertension Maternal Grandmother   . Cancer Maternal Grandfather     colon  . Hypertension Maternal Grandfather   . Stroke Maternal Grandfather     History  Substance Use Topics  . Smoking status: Former Research scientist (life sciences)  . Smokeless tobacco: Never Used     Comment: quit with + preg  . Alcohol Use: No    Allergies:  Allergies  Allergen Reactions  . Penicillins Cross Reactors Anaphylaxis  . Pineapple Shortness Of Breath and Swelling    Swelling of tongue    Prescriptions prior to admission  Medication Sig Dispense Refill Last Dose  . Prenatal Vit-Fe Fumarate-FA (PRENATAL VITAMINS PLUS) 27-1 MG TABS Take 1 daily 30 tablet 12 Past Week at Unknown time  . butalbital-acetaminophen-caffeine (FIORICET, ESGIC) 50-325-40 MG per tablet Take 1 tablet by mouth every 6 (six) hours as needed for  headache. (Patient not taking: Reported on 12/31/2014) 20 tablet 3 More than a month at Unknown time  . diphenhydramine-acetaminophen (TYLENOL PM) 25-500 MG TABS Take 1 tablet by mouth at bedtime as needed.   More than a month at Unknown time  . ibuprofen (ADVIL,MOTRIN) 800 MG tablet Take 800 mg by mouth every 8 (eight) hours as needed.   More than a month at Unknown time  . terconazole (TERAZOL 7) 0.4 % vaginal cream Place 1 applicator vaginally at bedtime. (Patient not taking: Reported on 12/31/2014) 45 g 0 Not Taking    ROS  LOF +FM -Ctxs -VB Physical Exam   Blood pressure 138/68, pulse 102, temperature 98.5 F (36.9 C), temperature source Oral, resp. rate 18, height 5' 4.5" (1.638 m), weight 310 lb (140.615 kg), last menstrual period 06/08/2014, SpO2 100 %, unknown if currently breastfeeding.    Physical Exam Gen: NAD Abdomen: gravid, soft, obese, no guarding or rebound tenderness SSE: Neg valsalva, neg pooling, neg fern Cvx: 8/67/-6, cephalic by [place] ED Course  Assessment: Neg ROM check Cat 1 FHRT BMI 52.9 GBS neg  Plan: D/C home Strict labor precautions Continue daily fetal movement counts per protocol Work note Keep OB & u/s appts today   Farrel Gordon CNM, MS 02/27/2015 6:34 AM

## 2015-02-28 ENCOUNTER — Encounter (HOSPITAL_COMMUNITY): Payer: Self-pay | Admitting: *Deleted

## 2015-02-28 ENCOUNTER — Inpatient Hospital Stay (HOSPITAL_COMMUNITY)
Admission: AD | Admit: 2015-02-28 | Discharge: 2015-03-02 | DRG: 775 | Disposition: A | Discharge status: Home / Self Care | Payer: Medicaid Other | Source: Ambulatory Visit | Attending: Obstetrics and Gynecology | Admitting: Obstetrics and Gynecology

## 2015-02-28 DIAGNOSIS — O358XX0 Maternal care for other (suspected) fetal abnormality and damage, not applicable or unspecified: Secondary | ICD-10-CM | POA: Diagnosis present

## 2015-02-28 DIAGNOSIS — Z87891 Personal history of nicotine dependence: Secondary | ICD-10-CM | POA: Diagnosis not present

## 2015-02-28 DIAGNOSIS — Z3A37 37 weeks gestation of pregnancy: Secondary | ICD-10-CM | POA: Diagnosis present

## 2015-02-28 DIAGNOSIS — Z283 Underimmunization status: Secondary | ICD-10-CM

## 2015-02-28 DIAGNOSIS — O99214 Obesity complicating childbirth: Secondary | ICD-10-CM | POA: Diagnosis present

## 2015-02-28 DIAGNOSIS — D649 Anemia, unspecified: Secondary | ICD-10-CM | POA: Diagnosis not present

## 2015-02-28 DIAGNOSIS — Z68.41 Body mass index (BMI) pediatric, 85th percentile to less than 95th percentile for age: Secondary | ICD-10-CM | POA: Diagnosis not present

## 2015-02-28 DIAGNOSIS — O09899 Supervision of other high risk pregnancies, unspecified trimester: Secondary | ICD-10-CM

## 2015-02-28 DIAGNOSIS — Z880 Allergy status to penicillin: Secondary | ICD-10-CM

## 2015-02-28 DIAGNOSIS — O9989 Other specified diseases and conditions complicating pregnancy, childbirth and the puerperium: Secondary | ICD-10-CM | POA: Diagnosis present

## 2015-02-28 DIAGNOSIS — Z2839 Other underimmunization status: Secondary | ICD-10-CM

## 2015-02-28 DIAGNOSIS — O99324 Drug use complicating childbirth: Secondary | ICD-10-CM | POA: Diagnosis present

## 2015-02-28 DIAGNOSIS — Z8249 Family history of ischemic heart disease and other diseases of the circulatory system: Secondary | ICD-10-CM | POA: Diagnosis not present

## 2015-02-28 DIAGNOSIS — O9081 Anemia of the puerperium: Secondary | ICD-10-CM | POA: Diagnosis not present

## 2015-02-28 DIAGNOSIS — Z823 Family history of stroke: Secondary | ICD-10-CM | POA: Diagnosis not present

## 2015-02-28 DIAGNOSIS — F1290 Cannabis use, unspecified, uncomplicated: Secondary | ICD-10-CM | POA: Diagnosis present

## 2015-02-28 DIAGNOSIS — Z833 Family history of diabetes mellitus: Secondary | ICD-10-CM | POA: Diagnosis not present

## 2015-02-28 LAB — TYPE AND SCREEN
ABO/RH(D): O POS
Antibody Screen: NEGATIVE

## 2015-02-28 LAB — CBC
HCT: 35.9 % — ABNORMAL LOW (ref 36.0–46.0)
HEMOGLOBIN: 12.1 g/dL (ref 12.0–15.0)
MCH: 29.2 pg (ref 26.0–34.0)
MCHC: 33.7 g/dL (ref 30.0–36.0)
MCV: 86.5 fL (ref 78.0–100.0)
Platelets: 217 10*3/uL (ref 150–400)
RBC: 4.15 MIL/uL (ref 3.87–5.11)
RDW: 13.9 % (ref 11.5–15.5)
WBC: 20 10*3/uL — AB (ref 4.0–10.5)

## 2015-02-28 LAB — POCT FERN TEST

## 2015-02-28 LAB — OB RESULTS CONSOLE GBS: GBS: NEGATIVE

## 2015-02-28 LAB — RPR: RPR Ser Ql: NONREACTIVE

## 2015-02-28 MED ORDER — ONDANSETRON HCL 4 MG/2ML IJ SOLN: 4.0000 mg | Freq: Four times a day (QID) | INTRAMUSCULAR | Status: DC | PRN

## 2015-02-28 MED ORDER — OXYTOCIN 10 UNIT/ML IJ SOLN
20.0000 [IU] | INTRAMUSCULAR | Status: DC | PRN
  Filled 2015-02-28: qty 2

## 2015-02-28 MED ORDER — OXYTOCIN 40 UNITS IN LACTATED RINGERS INFUSION - SIMPLE MED
62.5000 mL/h | INTRAVENOUS | Status: DC
  Filled 2015-02-28: qty 1000

## 2015-02-28 MED ORDER — ZOLPIDEM TARTRATE 5 MG PO TABS: 5.0000 mg | ORAL_TABLET | Freq: Every evening | ORAL | Status: DC | PRN

## 2015-02-28 MED ORDER — DIPHENHYDRAMINE HCL 25 MG PO CAPS: 25.0000 mg | ORAL_CAPSULE | Freq: Four times a day (QID) | ORAL | Status: DC | PRN

## 2015-02-28 MED ORDER — NALBUPHINE HCL 20 MG/ML IJ SOLN
20.0000 mg | INTRAMUSCULAR | Status: DC | PRN
  Filled 2015-02-28: qty 1

## 2015-02-28 MED ORDER — LACTATED RINGERS IV SOLN
500.0000 mL | Freq: Once | INTRAVENOUS | Status: DC
Start: 2015-02-28 — End: 2015-02-28

## 2015-02-28 MED ORDER — OXYCODONE-ACETAMINOPHEN 5-325 MG PO TABS: 1.0000 | ORAL_TABLET | ORAL | Status: DC | PRN

## 2015-02-28 MED ORDER — WITCH HAZEL-GLYCERIN EX PADS: 1.0000 "application " | MEDICATED_PAD | CUTANEOUS | Status: DC | PRN

## 2015-02-28 MED ORDER — LIDOCAINE HCL (PF) 1 % IJ SOLN
30.0000 mL | INTRAMUSCULAR | Status: AC | PRN
  Administered 2015-02-28: 30 mL via SUBCUTANEOUS
  Filled 2015-02-28: qty 30

## 2015-02-28 MED ORDER — BUTORPHANOL TARTRATE 1 MG/ML IJ SOLN: 1.0000 mg | INTRAMUSCULAR | Status: DC | PRN

## 2015-02-28 MED ORDER — OXYCODONE-ACETAMINOPHEN 5-325 MG PO TABS: 2.0000 | ORAL_TABLET | ORAL | Status: DC | PRN

## 2015-02-28 MED ORDER — TETANUS-DIPHTH-ACELL PERTUSSIS 5-2.5-18.5 LF-MCG/0.5 IM SUSP
0.5000 mL | Freq: Once | INTRAMUSCULAR | Status: AC
  Administered 2015-03-01: 0.5 mL via INTRAMUSCULAR
  Filled 2015-02-28: qty 0.5

## 2015-02-28 MED ORDER — IBUPROFEN 600 MG PO TABS
600.0000 mg | ORAL_TABLET | Freq: Four times a day (QID) | ORAL | Status: DC
  Administered 2015-02-28 – 2015-03-02 (×8): 600 mg via ORAL
  Filled 2015-02-28 (×8): qty 1

## 2015-02-28 MED ORDER — METHYLERGONOVINE MALEATE 0.2 MG/ML IJ SOLN: 0.2000 mg | INTRAMUSCULAR | Status: DC | PRN

## 2015-02-28 MED ORDER — SENNOSIDES-DOCUSATE SODIUM 8.6-50 MG PO TABS
2.0000 | ORAL_TABLET | ORAL | Status: DC
  Administered 2015-03-01 (×2): 2 via ORAL
  Filled 2015-02-28 (×2): qty 2

## 2015-02-28 MED ORDER — METHYLERGONOVINE MALEATE 0.2 MG PO TABS: 0.2000 mg | ORAL_TABLET | ORAL | Status: DC | PRN

## 2015-02-28 MED ORDER — ACETAMINOPHEN 325 MG PO TABS: 650.0000 mg | ORAL_TABLET | ORAL | Status: DC | PRN

## 2015-02-28 MED ORDER — FLEET ENEMA 7-19 GM/118ML RE ENEM: 1.0000 | ENEMA | RECTAL | Status: DC | PRN

## 2015-02-28 MED ORDER — SIMETHICONE 80 MG PO CHEW: 80.0000 mg | CHEWABLE_TABLET | ORAL | Status: DC | PRN

## 2015-02-28 MED ORDER — DIPHENHYDRAMINE HCL 50 MG/ML IJ SOLN: 12.5000 mg | INTRAMUSCULAR | Status: DC | PRN

## 2015-02-28 MED ORDER — ONDANSETRON HCL 4 MG PO TABS: 4.0000 mg | ORAL_TABLET | ORAL | Status: DC | PRN

## 2015-02-28 MED ORDER — ONDANSETRON HCL 4 MG/2ML IJ SOLN: 4.0000 mg | INTRAMUSCULAR | Status: DC | PRN

## 2015-02-28 MED ORDER — DIBUCAINE 1 % RE OINT: 1.0000 "application " | TOPICAL_OINTMENT | RECTAL | Status: DC | PRN

## 2015-02-28 MED ORDER — LACTATED RINGERS IV SOLN: 500.0000 mL | INTRAVENOUS | Status: DC | PRN

## 2015-02-28 MED ORDER — OXYTOCIN BOLUS FROM INFUSION: 500.0000 mL | INTRAVENOUS | Status: DC

## 2015-02-28 MED ORDER — EPHEDRINE 5 MG/ML INJ
10.0000 mg | INTRAVENOUS | Status: DC | PRN
  Filled 2015-02-28: qty 2

## 2015-02-28 MED ORDER — PHENYLEPHRINE 40 MCG/ML (10ML) SYRINGE FOR IV PUSH (FOR BLOOD PRESSURE SUPPORT)
80.0000 ug | PREFILLED_SYRINGE | INTRAVENOUS | Status: DC | PRN
  Filled 2015-02-28: qty 2

## 2015-02-28 MED ORDER — NALBUPHINE HCL 10 MG/ML IJ SOLN
20.0000 mg | INTRAMUSCULAR | Status: DC | PRN
  Administered 2015-02-28: 20 mg via SUBCUTANEOUS
  Filled 2015-02-28: qty 2

## 2015-02-28 MED ORDER — FENTANYL 2.5 MCG/ML BUPIVACAINE 1/10 % EPIDURAL INFUSION (WH - ANES): 14.0000 mL/h | INTRAMUSCULAR | Status: DC | PRN

## 2015-02-28 MED ORDER — OXYCODONE-ACETAMINOPHEN 5-325 MG PO TABS
1.0000 | ORAL_TABLET | ORAL | Status: DC | PRN
  Administered 2015-02-28: 1 via ORAL
  Filled 2015-02-28: qty 1

## 2015-02-28 MED ORDER — OXYTOCIN 10 UNIT/ML IJ SOLN
INTRAMUSCULAR | Status: AC
  Administered 2015-02-28: 10 [IU]
  Filled 2015-02-28: qty 2

## 2015-02-28 MED ORDER — CITRIC ACID-SODIUM CITRATE 334-500 MG/5ML PO SOLN: 30.0000 mL | ORAL | Status: DC | PRN

## 2015-02-28 MED ORDER — LANOLIN HYDROUS EX OINT: TOPICAL_OINTMENT | CUTANEOUS | Status: DC | PRN

## 2015-02-28 MED ORDER — PRENATAL MULTIVITAMIN CH: 1.0000 | ORAL_TABLET | Freq: Every day | ORAL | Status: DC

## 2015-02-28 MED ORDER — BENZOCAINE-MENTHOL 20-0.5 % EX AERO
1.0000 "application " | INHALATION_SPRAY | CUTANEOUS | Status: DC | PRN
  Administered 2015-02-28: 1 via TOPICAL
  Filled 2015-02-28: qty 56

## 2015-02-28 MED ORDER — LACTATED RINGERS IV SOLN: INTRAVENOUS | Status: DC

## 2015-02-28 NOTE — MAU Note (Signed)
C/o SROM and ucs since 0630 this Am;

## 2015-02-28 NOTE — Progress Notes (Signed)
  Subjective: Requesting pain medication.  OB and Anesthesia staff unable to access IV site.  Objective: BP 122/90 mmHg  Pulse 113  Temp(Src) 98.8 F (37.1 C) (Oral)  Resp 20  Ht 5\' 4"  (1.626 m)  Wt 140.615 kg (310 lb)  BMI 53.19 kg/m2  LMP 06/08/2014      FHT: Category 2, but improved from initial tracing.  No definite accels, but moderate variability, no variables at present UC:   regular, every 2-3 min SVE:   Dilation: 5.5 Effacement (%): 70 Station: -1 Exam by:: v Lonnell Chaput, cnm Leaking clear fluid  Assessment:  Active labor GBS negative  Plan: Nubain SQ now. Anesthesia MD notified for assistance with IV start.  Donnel Saxon CNM 02/28/2015, 9:10 AM

## 2015-02-28 NOTE — H&P (Signed)
Raven Roberts is a 19 y.o. female, G2P1001 at 13 6/7 weeks, presenting for SROM at 0630, ? yellow fluid, with UCs since.  Reports +FM.  Patient Active Problem List   Diagnosis Date Noted  . Active labor 02/28/2015  . Penicillin allergy 02/28/2015  . Rubella non-immune status, antepartum 02/28/2015  . Fetal pyelectasis--right 02/28/2015  . Maternal morbid obesity, antepartum   . Migraine without aura and without status migrainosus, not intractable 12/11/2014    History of present pregnancy: Patient entered care at 11 3/7 weeks.   EDC of 03/15/15 was established by LMP and congruent with [place] at 11 3/7 weeks..   Anatomy scan:  19 weeks, with limited anatomy, growth at 40%ile, normal fluid, cervix 4.33, and an anterior right lateral placenta.   Additional [place] evaluations:   23 5/7 weeks, f/u anatomy--still limited cardiac views, normal fluid MFM [place] 29 3/7 weeks for cardiac anatomy evaluation--EFW 3 lbs, 49%ile, normal fluid, vtx, completion of most cardiac anatomy, limited view of arches Weekly BPPs since 32 weeks for obesity--breech at 33 weeks 35 4/7 weeks--Mild pyelectasis (6 mm) of left kidney noted during BPP, normal fluid 36 5/7 weeks--EFW 6+7, 44.3%ile, AFI 16.97, 60%ile, BPP 8/8, left kidney pyelectasis 6.1 mm Significant prenatal events:  Entered care at 11 weeks in transfer from [facility].  Had spotting, normal [place] at that visit.  Admitted to MJ use 2 days before visit--UDS negative.  Declined genetic testing.  Referred to MFM for cardiac anatomy evaluation due to poor views on [place] at office x 2.  BPPs weekly from 32 weeks due to obesity.  Fetal right renal pyelectasis noted at 35 weeks, stable on recheck at 36 weeks, normal fluid.  Seen 02/27/15 in MAU for labor check, ? Fluid leaking.  Amnisure negative, cervix 3, 50%, vtx, -2.  Home undelivered. Last evaluation:  02/27/15--seen in office for BPP (WNL).  OB History    Gravida Para Term Preterm AB TAB SAB Ectopic Multiple Living    2 1 1  0 0 0 0 0 0 1    2013--SVB,14 hour labor, 7+14, female, epidural, delivered at [facility], Dr. Willis Modena, protracted early labor.  Past Medical History  Diagnosis Date  . No pertinent past medical history   . Migraines   . Asthma   . Infection     uti, trich  . Rectal pain, chronic    Past Surgical History  Procedure Laterality Date  . No past surgeries     Family History: family history includes Cancer in her maternal grandfather and maternal grandmother; Diabetes in her maternal aunt and maternal uncle; Hypertension in her father, maternal aunt, maternal grandfather, maternal grandmother, and mother; Stroke in her maternal grandfather.   Social History:  reports that she has quit smoking. She has never used smokeless tobacco. She reports that she uses illicit drugs (Marijuana). She reports that she does not drink alcohol.  Patient is Native American, of the [place], single, currently in school for hair stylist.   Prenatal Transfer Tool  Maternal Diabetes: No Genetic Screening: Declined Maternal Ultrasounds/Referrals: Abnormal:  Findings:   Fetal renal pyelectasis, right, mild 6 mm on last exam. Fetal Ultrasounds or other Referrals:  None Maternal Substance Abuse:  Yes:  Type: Marijuana use early pregnancy, negative UDS 10/27/14 Significant Maternal Medications:  None Significant Maternal Lab Results: Lab values include: Group B Strep negative, Other: Rubella non-immune  TDAP NA Flu NA  ROS:  Contractions, leaking fluid, +FM  Allergies  Allergen Reactions  .  Penicillins Cross Reactors Anaphylaxis  . Pineapple Shortness Of Breath and Swelling    Swelling of tongue     Dilation: 5.5 Effacement (%): 70 Station: -1 Exam by:: v Karyn Brull, cnm Blood pressure 122/90, pulse 113, temperature 98.8 F (37.1 C), temperature source Oral, resp. rate 20, height 5\' 4"  (1.626 m), weight 140.615 kg (310 lb), last menstrual period 06/08/2014, unknown if currently  breastfeeding.  Chest clear Heart RRR without murmur Abd gravid, NT, FH 39 cm Pelvic: As above, clear fluid noted. Ext: WNL:  FHR: Category 2--decreased variability, mild variables with UCs, baseline 150-160 UCs:  q 2-4 min.  Prenatal labs: ABO, Rh: O/Positive/-- (09/28 0000)O+ Antibody: Negative (09/28 0000)Neg Rubella:   Non-immune RPR: Nonreactive, Nonreactive (09/28 0000)  HBsAg: Negative (09/28 0000) Neg HIV: Non-reactive (09/28 0000)  GBS: Negative (04/21 0000) Sickle cell/Hgb electrophoresis:  AA Pap:  NA GC:  Negative 08/06/14, 10/29/14, 02/20/15 Chlamydia:  Negative 08/06/14, 10/29/14, 02/20/15 Genetic screenings:  Declned Glucola:  141, 3 hour GTT WNL Other:   Hgb 12.6 at NOB, 12.2 at 28 weeks E coli on urine culture 08/27/14, TOC negative 09/24/14.    Assessment/Plan: IUP at 37 6/7 weeks SROM at 0630 GBS negative Rubella non-immune PCN allergy  Plan: Admit to [facility] per consult with Dr. Charlesetta Garibaldi Routine [facility] orders Pain med/epidural prn--declines epidural.   Chauncey Bruno, [place], [place] 02/28/2015, 9:09 AM

## 2015-02-28 NOTE — Progress Notes (Signed)
UR chart review completed.  

## 2015-03-01 LAB — CBC
HEMATOCRIT: 33.8 % — AB (ref 36.0–46.0)
HEMOGLOBIN: 11.3 g/dL — AB (ref 12.0–15.0)
MCH: 29.2 pg (ref 26.0–34.0)
MCHC: 33.4 g/dL (ref 30.0–36.0)
MCV: 87.3 fL (ref 78.0–100.0)
Platelets: 240 10*3/uL (ref 150–400)
RBC: 3.87 MIL/uL (ref 3.87–5.11)
RDW: 14.1 % (ref 11.5–15.5)
WBC: 22.3 10*3/uL — ABNORMAL HIGH (ref 4.0–10.5)

## 2015-03-01 MED ORDER — MEASLES, MUMPS & RUBELLA VAC ~~LOC~~ INJ
0.5000 mL | INJECTION | Freq: Once | SUBCUTANEOUS | Status: AC
  Administered 2015-03-02: 0.5 mL via SUBCUTANEOUS
  Filled 2015-03-01: qty 0.5

## 2015-03-01 NOTE — Lactation Note (Signed)
This note was copied from the chart of Raven Roberts. Lactation Consultation Note Mom stated she BF her other child for 4 months then had to stop because her milk dried up. Asked if she supplemented during BF, stated no but had to after her milk dried up.  Discussed supply and demand. Gave hand pump to post pump. Mom hand expressed w/noted colostrum. Mom was BF baby when entered room sitting up leaning forward over baby hold cross-cradle position.  Mom has large soft breast, baby keeps popping off and on. Has good everted nipples. Encouraged football hold d/t mom holding breast tissue down to see baby. Mom liked football hold. Baby fussy at breast, encouraged to use "C" hold to firm nipple up so baby could feel in her mouth. Baby rooting at breast. Encouraged to massage breast to express colostrum during BF.  Mom asked RN for formula d/t fussiness. Spoke w/mom about supplementing will cause decrease in milk supply. Mom states she is going to breast and bottle. Patient Name: Raven Roberts [id number] Date: 03/01/2015 Reason for consult: Initial assessment   Maternal Data Has patient been taught Hand Expression?: Yes Does the patient have breastfeeding experience prior to this delivery?: Yes  Feeding Feeding Type: Breast Fed Length of feed: 15 min  LATCH Score/Interventions Latch: Repeated attempts needed to sustain latch, nipple held in mouth throughout feeding, stimulation needed to elicit sucking reflex. Intervention(s): Adjust position;Assist with latch;Breast massage;Breast compression  Audible Swallowing: A few with stimulation Intervention(s): Hand expression Intervention(s): Alternate breast massage  Type of Nipple: Everted at rest and after stimulation  Comfort (Breast/Nipple): Soft / non-tender     Hold (Positioning): Assistance needed to correctly position infant at breast and maintain latch. Intervention(s): Breastfeeding basics reviewed;Support Pillows;Position  options;Skin to skin  LATCH Score: 7  Lactation Tools Discussed/Used Tools: Pump Breast pump type: Manual Pump Review: Setup, frequency, and cleaning;Milk Storage Initiated by:: Allayne Stack RN Date initiated:: 03/01/15   Consult Status Consult Status: Follow-up Date: 03/02/15 Follow-up type: In-patient    Raven Roberts, [place]ta [place] 03/01/2015, 2:26 AM

## 2015-03-01 NOTE — Progress Notes (Addendum)
Raven Roberts   Subjective: Post Partum Day 1 Vaginal delivery, 1 degree laceration Patient up ad lib, denies syncope or dizziness. Reports consuming regular diet without issues and denies N/V No issues with urination and reports bleeding is appropriate  Feeding:  breastfeeding Contraceptive plan:   Mircrnor  Objective: Temp:  [97.4 F (36.3 C)-99.2 F (37.3 C)] 98.4 F (36.9 C) (04/22 0607) Pulse Rate:  [75-144] 75 (04/22 0607) Resp:  [18-22] 20 (04/22 0607) BP: (122-159)/(48-97) 125/73 mmHg (04/22 0607) SpO2:  [100 %] 100 % (04/22 0030) Weight:  [310 lb (140.615 kg)] 310 lb (140.615 kg) (04/21 0818)  Physical Exam:  General: alert and cooperative Ext: WNL, no edema. No evidence of DVT seen on physical exam. Breast: Soft filling Lungs: CTAB Heart RRR without murmur  Abdomen:  Soft, fundus firm, lochia scant, + bowel sounds, non distended, non tender Lochia: appropriate Uterine Fundus: firm Laceration: healing well    Recent Labs  02/28/15 0850 03/01/15 0552  HGB 12.1 11.3*  HCT 35.9* 33.8*    Assessment S/P Vaginal Delivery-Day 1 Stable  Normal Involution Breastfeeding   Plan: Continue current care Breastfeeding and Lactation consult  Pt desires DC today, pending peds Lactation support CBC   Evertte Sones, CNM, MSN 03/01/2015, 7:07 AM

## 2015-03-01 NOTE — Discharge Summary (Signed)
Vaginal Delivery Discharge Summary  ALL information will be verified prior to discharge  Raven Roberts  DOB:    02/19/1996 MRN:    [id number] CSN:    [id number]  Date of admission:                  02/28/15  Date of discharge:                   03/02/15  Procedures this admission: SVD  Date of Delivery: 02/28/15  Newborn Data:  Live born  Information for the patient's newborn:  Raven, Roberts [[id number]]  female   Live born female  Birth Weight: 6 lb 9.8 oz (2999 g) APGAR: 4, 8  Home with mother. Name: Raven Roberts   History of Present Illness: Raven Roberts is a 19 y.o. female, G2P2002, who presents at [redacted]w[redacted]d weeks gestation. The patient has been followed at the [facility] and Gynecology division of [facility]. She was admitted rupture of membranes. Her pregnancy has been complicated by:  Patient Active Problem List   Diagnosis Date Noted  . Penicillin allergy 02/28/2015  . Rubella non-immune status, antepartum 02/28/2015  . Fetal pyelectasis--right 02/28/2015  . Vaginal delivery 02/28/2015  . Maternal morbid obesity, antepartum   . Migraine without aura and without status migrainosus, not intractable 12/11/2014    Hospital course: The patient was admitted for SROM.   Her labor was not complicated. She proceeded to have a vaginal delivery of a healthy infant. Her delivery was not complicated. Her postpartum course was not complicated. She was discharged to home on postpartum day 2 doing well.  Feeding: breast  Contraception: oral progesterone-only contraceptive Pt understands this OCP must be taken at the same time every day and if she is more than two hours late she must use condoms as a back up for 7 days   Discharge hemoglobin: HEMOGLOBIN  Date Value Ref Range Status  03/01/2015 11.3* 12.0 - 15.0 g/dL Final   HCT  Date Value Ref Range Status  03/01/2015 33.8* 36.0 - 46.0 % Final    PreNatal Labs ABO, Rh:  --/--/O POS (04/21 1884)   Antibody: NEG (04/21 0850) Rubella:    non immune.   RPR: Non Reactive (04/21 0850)  HBsAg: Negative (09/28 0000)  HIV: Non-reactive (09/28 0000)  GBS: Negative (04/21 0000)  Discharge Physical Exam:  General: alert and cooperative Lochia: appropriate Uterine Fundus: firm Incision: healing well DVT Evaluation: No evidence of DVT seen on physical exam.  Intrapartum Procedures: spontaneous vaginal delivery Postpartum Procedures: Pt to receive MMR prior to DC Complications-Operative and Postpartum: 1 degree perineal laceration  Discharge Diagnoses: Term Pregnancy-delivered,  asymptomatic anemia  Activity:           pelvic rest Diet:                routine Medications: PNV, Ibuprofen, Iron and Percocet Condition:      stable     Postpartum Teaching: Nutrition, exercise, return to work or school, family visits, sexual activity, home rest, vaginal bleeding, pelvic rest, family planning, s/s of PPD, breast care peri-care and incision care   Discharge to: home  Follow-up Information    Follow up with [facility] In 6 weeks.   Specialty:  Obstetrics and Gynecology   Why:  Postpartum check up   Contact information:   [address] [place] [address] [phone]  Akilah Cureton, CNM, MSN 03/01/2015. 8:56 AM  All information will be verified prior to discharge   Postpartum Care After Vaginal Delivery  After you deliver your newborn (postpartum period), the usual stay in the hospital is 24 72 hours. If there were problems with your labor or delivery, or if you have other medical problems, you might be in the hospital longer.  While you are in the hospital, you will receive help and instructions on how to care for yourself and your newborn during the postpartum period.  While you are in the hospital:  Be sure to tell your nurses if you have pain or discomfort, as well as where  you feel the pain and what makes the pain worse.  If you had an incision made near your vagina (episiotomy) or if you had some tearing during delivery, the nurses may put ice packs on your episiotomy or tear. The ice packs may help to reduce the pain and swelling.  If you are breastfeeding, you may feel uncomfortable contractions of your uterus for a couple of weeks. This is normal. The contractions help your uterus get back to normal size.  It is normal to have some bleeding after delivery.  For the first 1 3 days after delivery, the flow is red and the amount may be similar to a period.  It is common for the flow to start and stop.  In the first few days, you may pass some small clots. Let your nurses know if you begin to pass large clots or your flow increases.  Do not  flush blood clots down the toilet before having the nurse look at them.  During the next 3 10 days after delivery, your flow should become more watery and pink or brown-tinged in color.  Ten to fourteen days after delivery, your flow should be a small amount of yellowish-white discharge.  The amount of your flow will decrease over the first few weeks after delivery. Your flow may stop in 6 8 weeks. Most women have had their flow stop by 12 weeks after delivery.  You should change your sanitary pads frequently.  Wash your hands thoroughly with soap and water for at least 20 seconds after changing pads, using the toilet, or before holding or feeding your newborn.  You should feel like you need to empty your bladder within the first 6 8 hours after delivery.  In case you become weak, lightheaded, or faint, call your nurse before you get out of bed for the first time and before you take a shower for the first time.  Within the first few days after delivery, your breasts may begin to feel tender and full. This is called engorgement. Breast tenderness usually goes away within 48 72 hours after engorgement occurs. You may  also notice milk leaking from your breasts. If you are not breastfeeding, do not stimulate your breasts. Breast stimulation can make your breasts produce more milk.  Spending as much time as possible with your newborn is very important. During this time, you and your newborn can feel close and get to know each other. Having your newborn stay in your room (rooming in) will help to strengthen the bond with your newborn. It will give you time to get to know your newborn and become comfortable caring for your newborn.  Your hormones change after delivery. Sometimes the hormone changes can temporarily cause you to feel sad or tearful. These feelings should not last more than a few days. If  these feelings last longer than that, you should talk to your caregiver.  If desired, talk to your caregiver about methods of family planning or contraception.  Talk to your caregiver about immunizations. Your caregiver may want you to have the following immunizations before leaving the hospital:  Tetanus, diphtheria, and pertussis (Tdap) or tetanus and diphtheria (Td) immunization. It is very important that you and your family (including grandparents) or others caring for your newborn are up-to-date with the Tdap or Td immunizations. The Tdap or Td immunization can help protect your newborn from getting ill.  Rubella immunization.  Varicella (chickenpox) immunization.  Influenza immunization. You should receive this annual immunization if you did not receive the immunization during your pregnancy. Document Released: 08/23/2007 Document Revised: 07/20/2012 Document Reviewed: 06/22/2012 [facility] Patient Information 2014 [place].   Postpartum Depression and Baby Blues  The postpartum period begins right after the birth of a baby. During this time, there is often a great amount of joy and excitement. It is also a time of considerable changes in the life of the parent(s). Regardless of how many times a  mother gives birth, each child brings new challenges and dynamics to the family. It is not unusual to have feelings of excitement accompanied by confusing shifts in moods, emotions, and thoughts. All mothers are at risk of developing postpartum depression or the "baby blues." These mood changes can occur right after giving birth, or they may occur many months after giving birth. The baby blues or postpartum depression can be mild or severe. Additionally, postpartum depression can resolve rather quickly, or it can be a long-term condition. CAUSES Elevated hormones and their rapid decline are thought to be a main cause of postpartum depression and the baby blues. There are a number of hormones that radically change during and after pregnancy. Estrogen and progesterone usually decrease immediately after delivering your baby. The level of thyroid hormone and various cortisol steroids also rapidly drop. Other factors that play a major role in these changes include major life events and genetics.  RISK FACTORS If you have any of the following risks for the baby blues or postpartum depression, know what symptoms to watch out for during the postpartum period. Risk factors that may increase the likelihood of getting the baby blues or postpartum depression include: 1. Havinga personal or family history of depression. 2. Having depression while being pregnant. 3. Having premenstrual or oral contraceptive-associated mood issues. 4. Having exceptional life stress. 5. Having marital conflict. 6. Lacking a social support network. 7. Having a baby with special needs. 8. Having health problems such as diabetes. SYMPTOMS Baby blues symptoms include:  Brief fluctuations in mood, such as going from extreme happiness to sadness.  Decreased concentration.  Difficulty sleeping.  Crying spells, tearfulness.  Irritability.  Anxiety. Postpartum depression symptoms typically begin within the first month after  giving birth. These symptoms include:  Difficulty sleeping or excessive sleepiness.  Marked weight loss.  Agitation.  Feelings of worthlessness.  Lack of interest in activity or food. Postpartum psychosis is a very concerning condition and can be dangerous. Fortunately, it is rare. Displaying any of the following symptoms is cause for immediate medical attention. Postpartum psychosis symptoms include:  Hallucinations and delusions.  Bizarre or disorganized behavior.  Confusion or disorientation. DIAGNOSIS  A diagnosis is made by an evaluation of your symptoms. There are no medical or lab tests that lead to a diagnosis, but there are various questionnaires that a caregiver may use to identify those with  the baby blues, postpartum depression, or psychosis. Often times, a screening tool called the [place] Postnatal Depression Scale is used to diagnose depression in the postpartum period.  TREATMENT The baby blues usually goes away on its own in 1 to 2 weeks. Social support is often all that is needed. You should be encouraged to get adequate sleep and rest. Occasionally, you may be given medicines to help you sleep.  Postpartum depression requires treatment as it can last several months or longer if it is not treated. Treatment may include individual or group therapy, medicine, or both to address any social, physiological, and psychological factors that may play a role in the depression. Regular exercise, a healthy diet, rest, and social support may also be strongly recommended.  Postpartum psychosis is more serious and needs treatment right away. Hospitalization is often needed. HOME CARE INSTRUCTIONS  Get as much rest as you can. Nap when the baby sleeps.  Exercise regularly. Some women find yoga and walking to be beneficial.  Eat a balanced and nourishing diet.  Do little things that you enjoy. Have a cup of tea, take a bubble bath, read your favorite magazine, or listen to your  favorite music.  Avoid alcohol.  Ask for help with household chores, cooking, grocery shopping, or running errands as needed. Do not try to do everything.  Talk to people close to you about how you are feeling. Get support from your partner, family members, friends, or other new moms.  Try to stay positive in how you think. Think about the things you are grateful for.  Do not spend a lot of time alone.  Only take medicine as directed by your caregiver.  Keep all your postpartum appointments.  Let your caregiver know if you have any concerns. SEEK MEDICAL CARE IF: You are having a reaction or problems with your medicine. SEEK IMMEDIATE MEDICAL CARE IF:  You have suicidal feelings.  You feel you may harm the baby or someone else. Document Released: 07/30/2004 Document Revised: 01/18/2012 Document Reviewed: 09/01/2011 [facility] Patient Information 2014 [place], [place].     Breastfeeding Deciding to breastfeed is one of the best choices you can make for you and your baby. A change in hormones during pregnancy causes your breast tissue to grow and increases the number and size of your milk ducts. These hormones also allow proteins, sugars, and fats from your blood supply to make breast milk in your milk-producing glands. Hormones prevent breast milk from being released before your baby is born as well as prompt milk flow after birth. Once breastfeeding has begun, thoughts of your baby, as well as his or her sucking or crying, can stimulate the release of milk from your milk-producing glands.  BENEFITS OF BREASTFEEDING For Your Baby  Your first milk (colostrum) helps your baby's digestive system function better.   There are antibodies in your milk that help your baby fight off infections.   Your baby has a lower incidence of asthma, allergies, and sudden infant death syndrome.   The nutrients in breast milk are better for your baby than infant formulas and are designed uniquely  for your baby's needs.   Breast milk improves your baby's brain development.   Your baby is less likely to develop other conditions, such as childhood obesity, asthma, or type 2 diabetes mellitus.  For You   Breastfeeding helps to create a very special bond between you and your baby.   Breastfeeding is convenient. Breast milk is always available at the  correct temperature and costs nothing.   Breastfeeding helps to burn calories and helps you lose the weight gained during pregnancy.   Breastfeeding makes your uterus contract to its prepregnancy size faster and slows bleeding (lochia) after you give birth.   Breastfeeding helps to lower your risk of developing type 2 diabetes mellitus, osteoporosis, and breast or ovarian cancer later in life. SIGNS THAT YOUR BABY IS HUNGRY Early Signs of Hunger  Increased alertness or activity.  Stretching.  Movement of the head from side to side.  Movement of the head and opening of the mouth when the corner of the mouth or cheek is stroked (rooting).  Increased sucking sounds, smacking lips, cooing, sighing, or squeaking.  Hand-to-mouth movements.  Increased sucking of fingers or hands. Late Signs of Hunger  Fussing.  Intermittent crying. Extreme Signs of Hunger Signs of extreme hunger will require calming and consoling before your baby will be able to breastfeed successfully. Do not wait for the following signs of extreme hunger to occur before you initiate breastfeeding:   Restlessness.  A loud, strong cry.   Screaming.   BREASTFEEDING BASICS Breastfeeding Initiation  Find a comfortable place to sit or lie down, with your neck and back well supported.  Place a pillow or rolled up blanket under your baby to bring him or her to the level of your breast (if you are seated). Nursing pillows are specially designed to help support your arms and your baby while you breastfeed.  Make sure that your baby's abdomen is facing  your abdomen.   Gently massage your breast. With your fingertips, massage from your chest wall toward your nipple in a circular motion. This encourages milk flow. You may need to continue this action during the feeding if your milk flows slowly.  Support your breast with 4 fingers underneath and your thumb above your nipple. Make sure your fingers are well away from your nipple and your baby's mouth.   Stroke your baby's lips gently with your finger or nipple.   When your baby's mouth is open wide enough, quickly bring your baby to your breast, placing your entire nipple and as much of the colored area around your nipple (areola) as possible into your baby's mouth.   More areola should be visible above your baby's upper lip than below the lower lip.   Your baby's tongue should be between his or her lower gum and your breast.   Ensure that your baby's mouth is correctly positioned around your nipple (latched). Your baby's lips should create a seal on your breast and be turned out (everted).  It is common for your baby to suck about 2-3 minutes in order to start the flow of breast milk. Latching Teaching your baby how to latch on to your breast properly is very important. An improper latch can cause nipple pain and decreased milk supply for you and poor weight gain in your baby. Also, if your baby is not latched onto your nipple properly, he or she may swallow some air during feeding. This can make your baby fussy. Burping your baby when you switch breasts during the feeding can help to get rid of the air. However, teaching your baby to latch on properly is still the best way to prevent fussiness from swallowing air while breastfeeding. Signs that your baby has successfully latched on to your nipple:    Silent tugging or silent sucking, without causing you pain.   Swallowing heard between every 3-4 sucks.  Muscle movement above and in front of his or her ears while sucking.  Signs  that your baby has not successfully latched on to nipple:   Sucking sounds or smacking sounds from your baby while breastfeeding.  Nipple pain. If you think your baby has not latched on correctly, slip your finger into the corner of your baby's mouth to break the suction and place it between your baby's gums. Attempt breastfeeding initiation again. Signs of Successful Breastfeeding Signs from your baby:   A gradual decrease in the number of sucks or complete cessation of sucking.   Falling asleep.   Relaxation of his or her body.   Retention of a small amount of milk in his or her mouth.   Letting go of your breast by himself or herself. Signs from you:  Breasts that have increased in firmness, weight, and size 1-3 hours after feeding.   Breasts that are softer immediately after breastfeeding.  Increased milk volume, as well as a change in milk consistency and color by the fifth day of breastfeeding.   Nipples that are not sore, cracked, or bleeding. Signs That Your [employer] is Getting Enough Milk  Wetting at least 3 diapers in a 24-hour period. The urine should be clear and pale yellow by age 16 days.  At least 3 stools in a 24-hour period by age 16 days. The stool should be soft and yellow.  At least 3 stools in a 24-hour period by age 28 days. The stool should be seedy and yellow.  No loss of weight greater than 10% of birth weight during the first 46 days of age.  Average weight gain of 4-7 ounces (113-198 g) per week after age 23 days.  Consistent daily weight gain by age 281 days, without weight loss after the age of 2 weeks. After a feeding, your baby may spit up a small amount. This is common. BREASTFEEDING FREQUENCY AND DURATION Frequent feeding will help you make more milk and can prevent sore nipples and breast engorgement. Breastfeed when you feel the need to reduce the fullness of your breasts or when your baby shows signs of hunger. This is called "breastfeeding on  demand." Avoid introducing a pacifier to your baby while you are working to establish breastfeeding (the first 4-6 weeks after your baby is born). After this time you may choose to use a pacifier. Research has shown that pacifier use during the first year of a baby's life decreases the risk of sudden infant death syndrome (SIDS). Allow your baby to feed on each breast as long as he or she wants. Breastfeed until your baby is finished feeding. When your baby unlatches or falls asleep while feeding from the first breast, offer the second breast. Because newborns are often sleepy in the first few weeks of life, you may need to awaken your baby to get him or her to feed. Breastfeeding times will vary from baby to baby. However, the following rules can serve as a guide to help you ensure that your baby is properly fed:  Newborns (babies 22 weeks of age or younger) may breastfeed every 1-3 hours.  Newborns should not go longer than 3 hours during the day or 5 hours during the night without breastfeeding.  You should breastfeed your baby a minimum of 8 times in a 24-hour period until you begin to introduce solid foods to your baby at around 32 months of age. BREAST MILK PUMPING Pumping and storing breast milk allows you to ensure  that your baby is exclusively fed your breast milk, even at times when you are unable to breastfeed. This is especially important if you are going back to work while you are still breastfeeding or when you are not able to be present during feedings. Your lactation consultant can give you guidelines on how long it is safe to store breast milk.  A breast pump is a machine that allows you to pump milk from your breast into a sterile bottle. The pumped breast milk can then be stored in a refrigerator or freezer. Some breast pumps are operated by hand, while others use electricity. Ask your lactation consultant which type will work best for you. Breast pumps can be purchased, but some  hospitals and breastfeeding support groups lease breast pumps on a monthly basis. A lactation consultant can teach you how to hand express breast milk, if you prefer not to use a pump.  CARING FOR YOUR BREASTS WHILE YOU BREASTFEED Nipples can become dry, cracked, and sore while breastfeeding. The following recommendations can help keep your breasts moisturized and healthy:  Avoid using soap on your nipples.   Wear a supportive bra. Although not required, special nursing bras and tank tops are designed to allow access to your breasts for breastfeeding without taking off your entire bra or top. Avoid wearing underwire-style bras or extremely tight bras.  Air dry your nipples for 3-17minutes after each feeding.   Use only cotton bra pads to absorb leaked breast milk. Leaking of breast milk between feedings is normal.   Use lanolin on your nipples after breastfeeding. Lanolin helps to maintain your skin's normal moisture barrier. If you use pure lanolin, you do not need to wash it off before feeding your baby again. Pure lanolin is not toxic to your baby. You may also hand express a few drops of breast milk and gently massage that milk into your nipples and allow the milk to air dry. In the first few weeks after giving birth, some women experience extremely full breasts (engorgement). Engorgement can make your breasts feel heavy, warm, and tender to the touch. Engorgement peaks within 3-5 days after you give birth. The following recommendations can help ease engorgement:  Completely empty your breasts while breastfeeding or pumping. You may want to start by applying warm, moist heat (in the shower or with warm water-soaked hand towels) just before feeding or pumping. This increases circulation and helps the milk flow. If your baby does not completely empty your breasts while breastfeeding, pump any extra milk after he or she is finished.  Wear a snug bra (nursing or regular) or tank top for 1-2 days  to signal your body to slightly decrease milk production.  Apply ice packs to your breasts, unless this is too uncomfortable for you.  Make sure that your baby is latched on and positioned properly while breastfeeding. If engorgement persists after 48 hours of following these recommendations, contact your health care provider or a Science writer. OVERALL HEALTH CARE RECOMMENDATIONS WHILE BREASTFEEDING  Eat healthy foods. Alternate between meals and snacks, eating 3 of each per day. Because what you eat affects your breast milk, some of the foods may make your baby more irritable than usual. Avoid eating these foods if you are sure that they are negatively affecting your baby.  Drink milk, fruit juice, and water to satisfy your thirst (about 10 glasses a day).   Rest often, relax, and continue to take your prenatal vitamins to prevent fatigue, stress, and anemia.  Continue breast self-awareness checks.  Avoid chewing and smoking tobacco.  Avoid alcohol and drug use. Some medicines that may be harmful to your baby can pass through breast milk. It is important to ask your health care provider before taking any medicine, including all over-the-counter and prescription medicine as well as vitamin and herbal supplements. It is possible to become pregnant while breastfeeding. If birth control is desired, ask your health care provider about options that will be safe for your baby. SEEK MEDICAL CARE IF:   You feel like you want to stop breastfeeding or have become frustrated with breastfeeding.  You have painful breasts or nipples.  Your nipples are cracked or bleeding.  Your breasts are red, tender, or warm.  You have a swollen area on either breast.  You have a fever or chills.  You have nausea or vomiting.  You have drainage other than breast milk from your nipples.  Your breasts do not become full before feedings by the fifth day after you give birth.  You feel sad and  depressed.  Your baby is too sleepy to eat well.  Your baby is having trouble sleeping.   Your baby is wetting less than 3 diapers in a 24-hour period.  Your baby has less than 3 stools in a 24-hour period.  Your baby's skin or the white part of his or her eyes becomes yellow.   Your baby is not gaining weight by 12 days of age. SEEK IMMEDIATE MEDICAL CARE IF:   Your baby is overly tired (lethargic) and does not want to wake up and feed.  Your baby develops an unexplained fever. Document Released: 10/26/2005 Document Revised: 10/31/2013 Document Reviewed: 04/19/2013 [facility] Patient Information 2015 [place], [place]. This information is not intended to replace advice given to you by your health care provider. Make sure you discuss any questions you have with your health care provider.

## 2015-03-01 NOTE — Progress Notes (Signed)
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  Infant UDS is negative and MDS is pending.    Cognitive State:  Able to Concentrate , Alert    Mood/Affect:  Bright , Interested , Happy , Comfortable , Calm    CSW Assessment:  MOB provided consent for her grandmother and her friend to remain in the room, and stated that CSW is able to complete full assessment in their presence.  MOB was observed to be attending to and interacting with the infant.  She displayed a full range in affect and was in a pleasant mood.    MOB denied questions, concerns, or needs as she transitions to the postpartum period. She reported feeling excited that the infant has been born, and she smiled numerous times as she reflected upon how she feels as she prepares to transition home. She stated that she feels comfortable providing infant care, and it was  evident in the room that she appeared at ease when breastfeeding the infant.  MOB stated that she has significant experience caring for children, and acknowledged that since this is her second child that she has increased sense of comfort. She openly processed how she feels as she transitions to caring for two children. MOB also endorsed feeling well supported by her family (who she stated lives nearby if needed).  MOB denied a mental health history, and denied a history of perinatal mood disorders. She presented as surprised to learn that mothers can experience symptoms since "my children are my everything".  MOB agreed to contact her medical provider if she notes symptoms.   CSW inquired about substance use during pregnancy.  MOB denied substance use history.  MOB disclosure likely due to her grandmother being in the room.    CSW Plan/Description:   1) Patient education-- CSW reviewed postpartum depression. MOB to contact medical provider if she notes symptoms.  2) CSW to monitor MDS and will make a CPS report if positive for substances.  3) No Further Intervention Required/No Barriers to Discharge, Patient/Family Education    Raven Roberts 03/01/2015, 2:10 PM

## 2015-03-02 MED ORDER — OXYCODONE-ACETAMINOPHEN 5-325 MG PO TABS: 1.0000 | ORAL_TABLET | ORAL | Status: DC | PRN

## 2015-03-02 MED ORDER — IBUPROFEN 600 MG PO TABS: 600.0000 mg | ORAL_TABLET | Freq: Four times a day (QID) | ORAL | Status: DC

## 2015-03-02 NOTE — Lactation Note (Signed)
This note was copied from the chart of Raven Francile Woolford. Lactation Consultation Note  Follow up visit made prior to discharge.  Mom states baby is latching well and cluster fed last night.  She reports her breasts are becoming fuller.  Asking how to keep her supply maintained well.  Reviewed supply and demand and discouraged formula use if breastfeeding is going well.  Outpatient services and support reviewed and encouraged.  Patient Name: Raven Roberts [id number] Date: 03/02/2015     Maternal Data    Feeding    LATCH Score/Interventions                      Lactation Tools Discussed/Used     Consult Status      Ave Filter 03/02/2015, 10:51 AM

## 2015-03-11 ENCOUNTER — Ambulatory Visit: Payer: Medicaid Other | Admitting: Nurse Practitioner

## 2015-03-13 ENCOUNTER — Encounter: Payer: Self-pay | Admitting: Nurse Practitioner

## 2016-06-30 ENCOUNTER — Inpatient Hospital Stay (HOSPITAL_COMMUNITY)
Admission: AD | Admit: 2016-06-30 | Discharge: 2016-06-30 | Disposition: A | Discharge status: Home / Self Care | Payer: Medicaid Other | Source: Ambulatory Visit | Attending: Obstetrics and Gynecology | Admitting: Obstetrics and Gynecology

## 2016-06-30 ENCOUNTER — Encounter (HOSPITAL_COMMUNITY): Payer: Self-pay | Admitting: *Deleted

## 2016-06-30 DIAGNOSIS — O26892 Other specified pregnancy related conditions, second trimester: Secondary | ICD-10-CM | POA: Insufficient documentation

## 2016-06-30 DIAGNOSIS — Z87891 Personal history of nicotine dependence: Secondary | ICD-10-CM | POA: Insufficient documentation

## 2016-06-30 DIAGNOSIS — R102 Pelvic and perineal pain: Secondary | ICD-10-CM | POA: Diagnosis present

## 2016-06-30 DIAGNOSIS — Z3A17 17 weeks gestation of pregnancy: Secondary | ICD-10-CM | POA: Diagnosis not present

## 2016-06-30 DIAGNOSIS — Z880 Allergy status to penicillin: Secondary | ICD-10-CM | POA: Insufficient documentation

## 2016-06-30 DIAGNOSIS — N949 Unspecified condition associated with female genital organs and menstrual cycle: Secondary | ICD-10-CM | POA: Diagnosis not present

## 2016-06-30 LAB — URINALYSIS, ROUTINE W REFLEX MICROSCOPIC
BILIRUBIN URINE: NEGATIVE
GLUCOSE, UA: NEGATIVE mg/dL
Hgb urine dipstick: NEGATIVE
KETONES UR: NEGATIVE mg/dL
Nitrite: NEGATIVE
PH: 6.5 (ref 5.0–8.0)
Protein, ur: NEGATIVE mg/dL
Specific Gravity, Urine: 1.025 (ref 1.005–1.030)

## 2016-06-30 LAB — URINE MICROSCOPIC-ADD ON: RBC / HPF: NONE SEEN RBC/hpf (ref 0–5)

## 2016-06-30 NOTE — Discharge Instructions (Signed)
Round Ligament Pain The round ligament is a cord of muscle and tissue that helps to support the uterus. It can become a source of pain during pregnancy if it becomes stretched or twisted as the baby grows. The pain usually begins in the second trimester of pregnancy, and it can come and go until the baby is delivered. It is not a serious problem, and it does not cause harm to the baby. Round ligament pain is usually a short, sharp, and pinching pain, but it can also be a dull, lingering, and aching pain. The pain is felt in the lower side of the abdomen or in the groin. It usually starts deep in the groin and moves up to the outside of the hip area. Pain can occur with:  A sudden change in position.  Rolling over in bed.  Coughing or sneezing.  Physical activity. HOME CARE INSTRUCTIONS Watch your condition for any changes. Take these steps to help with your pain:  When the pain starts, relax. Then try:  Sitting down.  Flexing your knees up to your abdomen.  Lying on your side with one pillow under your abdomen and another pillow between your legs.  Sitting in a warm bath for 15-20 minutes or until the pain goes away.  Take over-the-counter and prescription medicines only as told by your health care provider.  Move slowly when you sit and stand.  Avoid long walks if they cause pain.  Stop or lessen your physical activities if they cause pain. SEEK MEDICAL CARE IF:  Your pain does not go away with treatment.  You feel pain in your back that you did not have before.  Your medicine is not helping. SEEK IMMEDIATE MEDICAL CARE IF:  You develop a fever or chills.  You develop uterine contractions.  You develop vaginal bleeding.  You develop nausea or vomiting.  You develop diarrhea.  You have pain when you urinate.   This information is not intended to replace advice given to you by your health care provider. Make sure you discuss any questions you have with your health  care provider.   Document Released: 08/04/2008 Document Revised: 01/18/2012 Document Reviewed: 01/02/2015 Elsevier Interactive Patient Education [employer].

## 2016-06-30 NOTE — MAU Note (Signed)
Having pain in lower pelvis when she walks.  Started last Tuesday.  No bleeding or leaking.no GU complaints.  Some constipation.

## 2016-06-30 NOTE — MAU Provider Note (Signed)
Chief Complaint: Pelvic Pain   First Provider Initiated Contact with Patient 06/30/16 1457        SUBJECTIVE HPI: Raven Roberts is a 20 y.o. [id number] at [redacted]w[redacted]d by LMP who presents to maternity admissions reporting lower abdominal pain which comes when she walks or turns over in bed.  Pain is sharp, comes and goes.Located to sides of uterus, mostly on right.  She denies vaginal bleeding, vaginal itching/burning, urinary symptoms, h/a, dizziness, n/v, or fever/chills.    Abdominal Pain  This is a new problem. The current episode started in the past 7 days. The onset quality is sudden. The problem occurs intermittently. The problem has been waxing and waning. The pain is located in the LLQ and RLQ. The pain is moderate. The quality of the pain is cramping and sharp. The abdominal pain does not radiate. Pertinent negatives include no constipation, diarrhea, dysuria, fever, headaches, melena, nausea or vomiting. The pain is aggravated by palpation, movement and certain positions. The pain is relieved by being still. She has tried nothing for the symptoms.   RN Note: Having pain in lower pelvis when she walks.  Started last Tuesday.  No bleeding or leaking.no GU complaints.  Some constipation.  Past Medical History:  Diagnosis Date  . Asthma   . Infection    uti, trich  . Migraines   . No pertinent past medical history   . Rectal pain, chronic    Past Surgical History:  Procedure Laterality Date  . NO PAST SURGERIES     Social History   Social History  . Marital status: Single    Spouse name: N/A  . Number of children: 1  . Years of education: 12th   Occupational History  . Not on file.   Social History Main Topics  . Smoking status: Former Research scientist (life sciences)  . Smokeless tobacco: Never Used     Comment: quit with + preg  . Alcohol use No  . Drug use:     Types: Marijuana     Comment: last use Oct 2015   . Sexual activity: Yes    Birth control/ protection: None   Other Topics Concern   . Not on file   Social History Narrative   Patient is single and lives at home with her son.    Patient unemployed.   Education high school.    Right handed.   Caffeine once per month one cup.   No current facility-administered medications on file prior to encounter.    Current Outpatient Prescriptions on File Prior to Encounter  Medication Sig Dispense Refill  . Prenatal Vit-Fe Fumarate-FA (PRENATAL VITAMINS PLUS) 27-1 MG TABS Take 1 daily (Patient not taking: Reported on 06/30/2016) 30 tablet 12   Allergies  Allergen Reactions  . Penicillins Cross Reactors Anaphylaxis    Has patient had a PCN reaction causing immediate rash, facial/tongue/throat swelling, SOB or lightheadedness with hypotension: Yes Has patient had a PCN reaction causing severe rash involving mucus membranes or skin necrosis: Yes Has patient had a PCN reaction that required hospitalization Yes Has patient had a PCN reaction occurring within the last 10 years: No If all of the above answers are "NO", then may proceed with Cephalosporin use.   Marland Kitchen Pineapple Shortness Of Breath and Swelling    Swelling of tongue    I have reviewed patient's Past Medical Hx, Surgical Hx, Family Hx, Social Hx, medications and allergies.   ROS:  Review of Systems  Constitutional: Negative for chills and fever.  Gastrointestinal: Positive for abdominal pain. Negative for constipation, diarrhea, melena, nausea and vomiting.  Genitourinary: Positive for pelvic pain. Negative for dysuria, vaginal bleeding and vaginal discharge.  Neurological: Negative for headaches.   Other systems normal  Physical Exam  Patient Vitals for the past 24 hrs:  BP Temp Temp src Pulse Resp Height  06/30/16 1436 - - - - - 5\' 4"  (1.626 m)  06/30/16 1357 115/71 98.3 F (36.8 C) Oral 86 20 -   Physical Exam  Constitutional: Well-developed, well-nourished female in no acute distress. Holds side when moves. Cardiovascular: normal rate Respiratory: normal  effort GI: Abd soft, non-tender, except for over bilateral round ligaments.   Pos BS x 4 MS: Extremities nontender, no edema, normal ROM Neurologic: Alert and oriented x 4.  GU: Neg CVAT.  PELVIC EXAM: Long/closed  FHT 155 by doppler  LAB RESULTS Results for orders placed or performed during the hospital encounter of 06/30/16 (from the past 72 hour(s))  Urinalysis, Routine w reflex microscopic (not at [facility])     Status: Abnormal   Collection Time: 06/30/16  2:00 PM  Result Value Ref Range   Color, Urine YELLOW YELLOW   APPearance CLEAR CLEAR   Specific Gravity, Urine 1.025 1.005 - 1.030   pH 6.5 5.0 - 8.0   Glucose, UA NEGATIVE NEGATIVE mg/dL   Hgb urine dipstick NEGATIVE NEGATIVE   Bilirubin Urine NEGATIVE NEGATIVE   Ketones, ur NEGATIVE NEGATIVE mg/dL   Protein, ur NEGATIVE NEGATIVE mg/dL   Nitrite NEGATIVE NEGATIVE   Leukocytes, UA MODERATE (A) NEGATIVE  Urine microscopic-add on     Status: Abnormal   Collection Time: 06/30/16  2:00 PM  Result Value Ref Range   Squamous Epithelial / LPF 6-30 (A) NONE SEEN   WBC, UA 0-5 0 - 5 WBC/hpf   RBC / HPF NONE SEEN 0 - 5 RBC/hpf   Bacteria, UA FEW (A) NONE SEEN   Urine-Other MUCOUS PRESENT     IMAGING No results found.  MAU Management/MDM: UA ordered and reviewed, normal Exam and history is consistent with round ligament pain Discussed belly band for support and slow position changes  ASSESSMENT SIUP at [redacted]w[redacted]d Round ligament pain  PLAN Discharge home Discussed round ligament pain and comfort measures Abdominal support belt Discussed signs to report   Pt stable at time of discharge. Encouraged to return here or to other Urgent Care/ED if she develops worsening of symptoms, increase in pain, fever, or other concerning symptoms.    Hansel Feinstein CNM, MSN Certified Nurse-Midwife 06/30/2016  2:57 PM

## 2016-06-30 NOTE — MAU Note (Signed)
Urine in lab 

## 2016-11-09 NOTE — L&D Delivery Note (Addendum)
Delivery Note  Shortly after receiving epidural pt reported increased pelvic pressure; noted to be complete. She pushed twice and at 03:52a viable female was delivered via  (Presentation:LOA;  ).  APGAR: 9/9 , ; weight pending .   Placenta status: spontanously delivered, intact, shultz, .  Cord: 3vc with the following complications: none.   Anesthesia:  epidural Episiotomy:  none Lacerations:  First degree perineal Suture Repair: one stitch using 4-0 vicryl suture to stop bleeding Est. Blood Loss (mL):    Mom to postpartum.  Baby to Couplet care / Skin to Skin.  [place] 11/23/2016, 4:05 AM

## 2016-11-14 ENCOUNTER — Encounter (HOSPITAL_COMMUNITY): Payer: Self-pay

## 2016-11-14 ENCOUNTER — Inpatient Hospital Stay (HOSPITAL_COMMUNITY)
Admission: AD | Admit: 2016-11-14 | Discharge: 2016-11-14 | Disposition: A | Discharge status: Home / Self Care | Payer: Medicaid Other | Source: Ambulatory Visit | Attending: Obstetrics and Gynecology | Admitting: Obstetrics and Gynecology

## 2016-11-14 DIAGNOSIS — Z823 Family history of stroke: Secondary | ICD-10-CM | POA: Insufficient documentation

## 2016-11-14 DIAGNOSIS — Z833 Family history of diabetes mellitus: Secondary | ICD-10-CM | POA: Diagnosis not present

## 2016-11-14 DIAGNOSIS — Z8249 Family history of ischemic heart disease and other diseases of the circulatory system: Secondary | ICD-10-CM | POA: Diagnosis not present

## 2016-11-14 DIAGNOSIS — Z809 Family history of malignant neoplasm, unspecified: Secondary | ICD-10-CM | POA: Insufficient documentation

## 2016-11-14 DIAGNOSIS — Z87891 Personal history of nicotine dependence: Secondary | ICD-10-CM | POA: Diagnosis not present

## 2016-11-14 DIAGNOSIS — R040 Epistaxis: Secondary | ICD-10-CM | POA: Diagnosis not present

## 2016-11-14 DIAGNOSIS — O99513 Diseases of the respiratory system complicating pregnancy, third trimester: Secondary | ICD-10-CM | POA: Diagnosis present

## 2016-11-14 DIAGNOSIS — Z3A36 36 weeks gestation of pregnancy: Secondary | ICD-10-CM | POA: Insufficient documentation

## 2016-11-14 NOTE — MAU Note (Signed)
URINE IN LAB 

## 2016-11-14 NOTE — MAU Note (Signed)
Pt had a bloody nose in the middle of the night. Was able to stop that. Later today had another one that was difficult to stop and had a big blood clot come out.

## 2016-11-14 NOTE — MAU Provider Note (Signed)
History   G3P2002 @ 36.4 wks in with c/o nosebleed x 2. One this morning and one this afternoon. Denies ROM or vag bleeding. State as a child she had frequent nosebleeds.  CSN: [id number]  Arrival date & time 11/14/16  1727   None     Chief Complaint  Patient presents with  . Epistaxis    HPI  Past Medical History:  Diagnosis Date  . Asthma   . Infection    uti, trich  . Migraines   . No pertinent past medical history   . Rectal pain, chronic     Past Surgical History:  Procedure Laterality Date  . NO PAST SURGERIES      Family History  Problem Relation Age of Onset  . Hypertension Mother   . Hypertension Father   . Diabetes Maternal Aunt   . Hypertension Maternal Aunt   . Diabetes Maternal Uncle   . Cancer Maternal Grandmother   . Hypertension Maternal Grandmother   . Cancer Maternal Grandfather     colon  . Hypertension Maternal Grandfather   . Stroke Maternal Grandfather     Social History  Substance Use Topics  . Smoking status: Former Research scientist (life sciences)  . Smokeless tobacco: Never Used     Comment: quit with + preg  . Alcohol use No    OB History    Gravida Para Term Preterm AB Living   3 2 2  0 0 2   SAB TAB Ectopic Multiple Live Births   0 0 0 0 2      Review of Systems  Constitutional: Negative.   HENT: Positive for nosebleeds.   Eyes: Negative.   Respiratory: Negative.   Cardiovascular: Negative.   Gastrointestinal: Negative.   Endocrine: Negative.   Genitourinary: Negative.   Musculoskeletal: Negative.   Skin: Negative.   Allergic/Immunologic: Negative.   Neurological: Negative.   Hematological: Negative.   Psychiatric/Behavioral: Negative.     Allergies  Penicillins cross reactors and Pineapple  Home Medications    BP 129/72   Pulse 86   Temp 97.5 F (36.4 C)   Resp 16   Ht 5\' 4"  (1.626 m)   Wt 292 lb 1.3 oz (132.5 kg)   BMI 50.14 kg/m   Physical Exam  Constitutional: She is oriented to person, place, and time. She appears  well-developed and well-nourished.  HENT:  Head: Normocephalic.  Eyes: Pupils are equal, round, and reactive to light.  Neck: Normal range of motion.  Cardiovascular: Normal rate, regular rhythm, normal heart sounds and intact distal pulses.   Pulmonary/Chest: Effort normal and breath sounds normal.  Abdominal: Soft. Bowel sounds are normal.  Genitourinary: Vagina normal and uterus normal.  Musculoskeletal: Normal range of motion.  Neurological: She is alert and oriented to person, place, and time. She has normal reflexes.  Skin: Skin is warm and dry.  Psychiatric: She has a normal mood and affect. Her behavior is normal. Judgment and thought content normal.    MAU Course  Procedures (including critical care time)  Labs Reviewed - No data to display No results found.   1. Nosebleed       MDM  Stable maternal fetal unit, reassuring FHR with accels noted. No active nosebleed at present. POC discussed with Dr. Willis Modena pt to be d/c home

## 2016-11-14 NOTE — MAU Note (Signed)
Patient states she had a nose bleed earlier and a big clot came out and then again a short time ago. Was at MD office yesterday and her BP was fine, denies any other problems.

## 2016-11-14 NOTE — Discharge Instructions (Signed)
Nosebleed Nosebleeds are common. A nosebleed can be caused by many things, including:  Getting hit hard in the nose.  Infections.  Dryness in your nose.  A dry climate.  Medicines.  Picking your nose.  Your home heating and cooling systems. HOME CARE   Try controlling your nosebleed by pinching your nostrils gently. Do this for at least 10 minutes.  Avoid blowing or sniffing your nose for a number of hours after having a nosebleed.  Do not put gauze inside of your nose yourself. If your nose was packed by your doctor, try to keep the pack inside of your nose until your doctor removes it.  If a gauze pack was used and it starts to fall out, gently replace it or cut off the end of it.  If a balloon catheter was used to pack your nose, do not cut or remove it unless told by your doctor.  Avoid lying down while you are having a nosebleed. Sit up and lean forward.  Use a nasal spray decongestant to help with a nosebleed as told by your doctor.  Do not use petroleum jelly or mineral oil in your nose. These can drip into your lungs.  Keep your house humid by using:  Less air conditioning.  A humidifier.  Aspirin and blood thinners make bleeding more likely. If you are prescribed these medicines and you have nosebleeds, ask your doctor if you should stop taking the medicines or adjust the dose. Do not stop medicines unless told by your doctor.  Resume your normal activities as you are able. Avoid straining, lifting, or bending at your waist for several days.  If your nosebleed was caused by dryness in your nose, use over-the-counter saline nasal spray or gel. If you must use a lubricant:  Choose one that is water-soluble.  Use it only as needed.  Do not use it within several hours of lying down.  Keep all follow-up visits as told by your doctor. This is important. GET HELP IF:  You have a fever.  You get frequent nosebleeds.  You are getting nosebleeds more  often. GET HELP RIGHT AWAY IF:  Your nosebleed lasts longer than 20 minutes.  Your nosebleed occurs after an injury to your face, and your nose looks crooked or broken.  You have unusual bleeding from other parts of your body.  You have unusual bruising on other parts of your body.  You feel light-headed or dizzy.  You become sweaty.  You throw up (vomit) blood.  You have a nosebleed after a head injury. This information is not intended to replace advice given to you by your health care provider. Make sure you discuss any questions you have with your health care provider. Document Released: 08/04/2008 Document Revised: 11/16/2014 Document Reviewed: 06/11/2014 Elsevier Interactive Patient Education  2017 [employer] Inc[employer]  

## 2016-11-15 NOTE — Progress Notes (Signed)
FHT from 1-6 reviewed.  Reactive tracing, no significant decels or regular ctx.

## 2016-11-22 ENCOUNTER — Inpatient Hospital Stay (HOSPITAL_COMMUNITY): Payer: Medicaid Other

## 2016-11-22 ENCOUNTER — Encounter (HOSPITAL_COMMUNITY): Payer: Self-pay

## 2016-11-22 ENCOUNTER — Inpatient Hospital Stay (HOSPITAL_COMMUNITY)
Admission: AD | Admit: 2016-11-22 | Discharge: 2016-11-24 | DRG: 775 | Disposition: A | Discharge status: Home / Self Care | Payer: Medicaid Other | Source: Ambulatory Visit | Attending: Obstetrics and Gynecology | Admitting: Obstetrics and Gynecology

## 2016-11-22 DIAGNOSIS — O99214 Obesity complicating childbirth: Secondary | ICD-10-CM | POA: Diagnosis present

## 2016-11-22 DIAGNOSIS — Z6841 Body Mass Index (BMI) 40.0 and over, adult: Secondary | ICD-10-CM

## 2016-11-22 DIAGNOSIS — Z3493 Encounter for supervision of normal pregnancy, unspecified, third trimester: Secondary | ICD-10-CM | POA: Diagnosis present

## 2016-11-22 DIAGNOSIS — Z833 Family history of diabetes mellitus: Secondary | ICD-10-CM

## 2016-11-22 DIAGNOSIS — Z87891 Personal history of nicotine dependence: Secondary | ICD-10-CM

## 2016-11-22 DIAGNOSIS — Z8249 Family history of ischemic heart disease and other diseases of the circulatory system: Secondary | ICD-10-CM | POA: Diagnosis not present

## 2016-11-22 DIAGNOSIS — Z823 Family history of stroke: Secondary | ICD-10-CM

## 2016-11-22 DIAGNOSIS — Z3A37 37 weeks gestation of pregnancy: Secondary | ICD-10-CM

## 2016-11-22 DIAGNOSIS — O368390 Maternal care for abnormalities of the fetal heart rate or rhythm, unspecified trimester, not applicable or unspecified: Secondary | ICD-10-CM

## 2016-11-22 DIAGNOSIS — Z3490 Encounter for supervision of normal pregnancy, unspecified, unspecified trimester: Secondary | ICD-10-CM

## 2016-11-22 LAB — CBC
HEMATOCRIT: 32.5 % — AB (ref 36.0–46.0)
Hemoglobin: 11 g/dL — ABNORMAL LOW (ref 12.0–15.0)
MCH: 29.8 pg (ref 26.0–34.0)
MCHC: 33.8 g/dL (ref 30.0–36.0)
MCV: 88.1 fL (ref 78.0–100.0)
PLATELETS: 163 10*3/uL (ref 150–400)
RBC: 3.69 MIL/uL — ABNORMAL LOW (ref 3.87–5.11)
RDW: 14.3 % (ref 11.5–15.5)
WBC: 10.6 10*3/uL — ABNORMAL HIGH (ref 4.0–10.5)

## 2016-11-22 LAB — TYPE AND SCREEN
ABO/RH(D): O POS
Antibody Screen: NEGATIVE

## 2016-11-22 LAB — URINALYSIS, ROUTINE W REFLEX MICROSCOPIC
BACTERIA UA: NONE SEEN
Bilirubin Urine: NEGATIVE
Glucose, UA: NEGATIVE mg/dL
Hgb urine dipstick: NEGATIVE
Ketones, ur: 80 mg/dL — AB
Nitrite: NEGATIVE
Protein, ur: 100 mg/dL — AB
SPECIFIC GRAVITY, URINE: 1.027 (ref 1.005–1.030)
pH: 6 (ref 5.0–8.0)

## 2016-11-22 LAB — WET PREP, GENITAL
Sperm: NONE SEEN
Trich, Wet Prep: NONE SEEN
YEAST WET PREP: NONE SEEN

## 2016-11-22 LAB — AMNISURE RUPTURE OF MEMBRANE (ROM) NOT AT ARMC: Amnisure ROM: NEGATIVE

## 2016-11-22 MED ORDER — OXYTOCIN 40 UNITS IN LACTATED RINGERS INFUSION - SIMPLE MED
1.0000 m[IU]/min | INTRAVENOUS | Status: DC
  Administered 2016-11-22: 2 m[IU]/min via INTRAVENOUS
  Filled 2016-11-22: qty 1000

## 2016-11-22 MED ORDER — BUTORPHANOL TARTRATE 1 MG/ML IJ SOLN
1.0000 mg | INTRAMUSCULAR | Status: DC | PRN
  Administered 2016-11-22 (×2): 1 mg via INTRAVENOUS
  Filled 2016-11-22 (×2): qty 1

## 2016-11-22 MED ORDER — LACTATED RINGERS IV SOLN
INTRAVENOUS | Status: DC
  Administered 2016-11-22: 23:00:00 via INTRAVENOUS

## 2016-11-22 MED ORDER — TERBUTALINE SULFATE 1 MG/ML IJ SOLN
0.2500 mg | Freq: Once | INTRAMUSCULAR | Status: DC | PRN
  Filled 2016-11-22: qty 1

## 2016-11-22 MED ORDER — LIDOCAINE HCL (PF) 1 % IJ SOLN
30.0000 mL | INTRAMUSCULAR | Status: DC | PRN
  Filled 2016-11-22: qty 30

## 2016-11-22 MED ORDER — SOD CITRATE-CITRIC ACID 500-334 MG/5ML PO SOLN: 30.0000 mL | ORAL | Status: DC | PRN

## 2016-11-22 MED ORDER — SODIUM CHLORIDE 0.9 % IV SOLN: Freq: Once | INTRAVENOUS | Status: DC

## 2016-11-22 MED ORDER — ACETAMINOPHEN 325 MG PO TABS: 650.0000 mg | ORAL_TABLET | ORAL | Status: DC | PRN

## 2016-11-22 MED ORDER — ONDANSETRON HCL 4 MG/2ML IJ SOLN: 4.0000 mg | Freq: Four times a day (QID) | INTRAMUSCULAR | Status: DC | PRN

## 2016-11-22 MED ORDER — LACTATED RINGERS IV SOLN: 500.0000 mL | INTRAVENOUS | Status: DC | PRN

## 2016-11-22 MED ORDER — OXYTOCIN 40 UNITS IN LACTATED RINGERS INFUSION - SIMPLE MED
2.5000 [IU]/h | INTRAVENOUS | Status: DC
Start: 2016-11-22 — End: 2016-11-23

## 2016-11-22 MED ORDER — OXYCODONE-ACETAMINOPHEN 5-325 MG PO TABS: 2.0000 | ORAL_TABLET | ORAL | Status: DC | PRN

## 2016-11-22 MED ORDER — OXYCODONE-ACETAMINOPHEN 5-325 MG PO TABS: 1.0000 | ORAL_TABLET | ORAL | Status: DC | PRN

## 2016-11-22 MED ORDER — OXYTOCIN BOLUS FROM INFUSION
500.0000 mL | Freq: Once | INTRAVENOUS | Status: AC
  Administered 2016-11-23: 500 mL via INTRAVENOUS

## 2016-11-22 NOTE — MAU Note (Signed)
Pt presents to MAU with decreased fetal movement, possible ROM, fever and body aches. Pt reports she woke up today not feeling well, took a tylenol around 1pm and took a nap. After waking up she stood and felt a gush of fluid. Pt continues to leak. Reports felt baby move before she took the tylenol. Denies any bleeding.

## 2016-11-22 NOTE — Progress Notes (Signed)
Patient ID: Raven Roberts, female   DOB: [birth year], 21 y.o.   MRN: [id number] Pt reports pain with contractions but not ready for pain meds yet.  VSS EFM - 150s, cat 1 TOCO - contractions q 34mins SVE - 4.5/50/-2  A/P: [id number] at 20 5/7WKS progressing in labor         AROM performed with blood tinged clear fluid         IUPC and FSE placed         Continue with expectant mgmt         Pain control prn

## 2016-11-22 NOTE — MAU Provider Note (Signed)
History   Patient Raven Roberts is a 21 year old G3P2002 here with complaints of leaking of fluid and cramping.   CSN: [id number]  Arrival date and time: 11/22/16 1542   None     Chief Complaint  Patient presents with  . Decreased Fetal Movement  . Rupture of Membranes  . Fever   Vaginal Discharge  The patient's primary symptoms include vaginal discharge. The patient's pertinent negatives include no genital itching, genital lesions, genital odor, genital rash, missed menses, pelvic pain or vaginal bleeding. This is a new problem. The current episode started today. The problem occurs 2 to 4 times per day. The patient is experiencing no pain. Associated symptoms include abdominal pain. Pertinent negatives include no anorexia, back pain, chills, constipation, diarrhea, discolored urine, dysuria, fever, flank pain, frequency, headaches, hematuria, joint pain, joint swelling, nausea, painful intercourse, rash, sore throat, urgency or vomiting. The vaginal discharge was clear. There has been no bleeding. She has not been passing clots. She has not been passing tissue. Nothing aggravates the symptoms. She has tried nothing for the symptoms.    OB History    Gravida Para Term Preterm AB Living   3 2 2  0 0 2   SAB TAB Ectopic Multiple Live Births   0 0 0 0 2      Past Medical History:  Diagnosis Date  . Asthma   . Infection    uti, trich  . Migraines   . No pertinent past medical history   . Rectal pain, chronic     Past Surgical History:  Procedure Laterality Date  . NO PAST SURGERIES      Family History  Problem Relation Age of Onset  . Hypertension Mother   . Hypertension Father   . Diabetes Maternal Aunt   . Hypertension Maternal Aunt   . Diabetes Maternal Uncle   . Cancer Maternal Grandmother   . Hypertension Maternal Grandmother   . Cancer Maternal Grandfather     colon  . Hypertension Maternal Grandfather   . Stroke Maternal Grandfather     Social History   Substance Use Topics  . Smoking status: Former Research scientist (life sciences)  . Smokeless tobacco: Never Used     Comment: quit with + preg  . Alcohol use No    Allergies:  Allergies  Allergen Reactions  . Amoxicillin Anaphylaxis and Other (See Comments)    Has patient had a PCN reaction causing immediate rash, facial/tongue/throat swelling, SOB or lightheadedness with hypotension: Yes Has patient had a PCN reaction causing severe rash involving mucus membranes or skin necrosis: No Has patient had a PCN reaction that required hospitalization No Has patient had a PCN reaction occurring within the last 10 years: No If all of the above answers are "NO", then may proceed with Cephalosporin use.  Marland Kitchen Penicillins Anaphylaxis and Other (See Comments)    Has patient had a PCN reaction causing immediate rash, facial/tongue/throat swelling, SOB or lightheadedness with hypotension: Yes Has patient had a PCN reaction causing severe rash involving mucus membranes or skin necrosis: No Has patient had a PCN reaction that required hospitalization No Has patient had a PCN reaction occurring within the last 10 years: No If all of the above answers are "NO", then may proceed with Cephalosporin use.  Marland Kitchen Pineapple Anaphylaxis and Shortness Of Breath    Prescriptions Prior to Admission  Medication Sig Dispense Refill Last Dose  . acetaminophen (TYLENOL) 500 MG tablet Take 500 mg by mouth every 6 (six) hours as  needed for mild pain, moderate pain or headache.   11/22/2016 at 1300    Review of Systems  Constitutional: Negative for chills and fever.  HENT: Negative for sore throat.   Gastrointestinal: Positive for abdominal pain. Negative for anorexia, constipation, diarrhea, nausea and vomiting.  Genitourinary: Positive for vaginal discharge. Negative for dysuria, flank pain, frequency, hematuria, missed menses, pelvic pain and urgency.  Musculoskeletal: Negative for back pain and joint pain.  Skin: Negative for rash.   Neurological: Negative for headaches.   Physical Exam   Blood pressure 118/74, pulse (!) 135, temperature 97.5 F (36.4 C), temperature source Oral, resp. rate 18, SpO2 99 %, unknown if currently breastfeeding.  Physical Exam  Constitutional: She is oriented to person, place, and time. She appears well-developed.  HENT:  Head: Normocephalic.  Neck: Normal range of motion.  Respiratory: Effort normal.  GI: Soft. She exhibits no distension and no mass. There is no tenderness. There is no rebound and no guarding.  Genitourinary:  Genitourinary Comments: NEFG, vaginal walls pink with no lesions. no pooling, no bleeding in the vagina. Cervix is 2-3, 50%, -3.   Neurological: She is alert and oriented to person, place, and time.  Skin: Skin is warm and dry.    MAU Course  Procedures  MDM -ferning and amnisure negative -NST: baseline 180, moderate variability, accelerations, no decelerations. Difficulty tracing baby due to maternal habitus and maternal position.  -BPP 8/8 -UA: Ketones present -IV hydration started at approximately 7:00PM after multiple attempts. [place] IV team came to place ID.  Assessment and Plan  Patient is increasingly uncomfortable, now 4 cm/50/midline. Dr. Terri Piedra at the bedside to evaluate patient. Patient to be admitted for labor.   Mervyn Skeeters Kooistra CNM 11/22/2016, 8:10 PM

## 2016-11-22 NOTE — H&P (Signed)
Raven Roberts is a 21 y.o. female presenting with report of leakage of fluid, headache and generalized malaise and contractions. Fetal tachycardia was initially noted in MAU and ensuing BPP was 8/8. Amnisure was negative but pt was noted to make cervical change from 2-3cm to 4cm. Pt admitted for labor Her dating is based on LMP and confirmed with a first trimester [place] at [redacted]weeks gestation. She declined genetic testing  And is neg for CF/SMA/FraX. An EIF was noted on anatomy US. She is GBS neg/Opos/Antibody neg  An early positive chlamydia was treated and neg on subsequent check  OB History    Gravida Para Term Preterm AB Living   3 2 2  0 0 2   SAB TAB Ectopic Multiple Live Births   0 0 0 0 2     Past Medical History:  Diagnosis Date  . Asthma   . Infection    uti, trich  . Migraines   . No pertinent past medical history   . Rectal pain, chronic    Past Surgical History:  Procedure Laterality Date  . NO PAST SURGERIES     Family History: family history includes Cancer in her maternal grandfather and maternal grandmother; Diabetes in her maternal aunt and maternal uncle; Hypertension in her father, maternal aunt, maternal grandfather, maternal grandmother, and mother; Stroke in her maternal grandfather. Social History:  reports that she has quit smoking. She has never used smokeless tobacco. She reports that she uses drugs, including Marijuana. She reports that she does not drink alcohol.     Maternal Diabetes: No Genetic Screening: Declined Maternal Ultrasounds/Referrals: Normal EIF only Fetal Ultrasounds or other Referrals:  None Maternal Substance Abuse:  No Significant Maternal Medications:  None Significant Maternal Lab Results:  Lab values include: Group B Strep negative Other Comments:  None  Review of Systems  Constitutional: Positive for chills, fever and malaise/fatigue.  Eyes: Negative for blurred vision.  Respiratory: Negative for sputum production.    Cardiovascular: Negative for palpitations.  Gastrointestinal: Positive for abdominal pain. Negative for heartburn, nausea and vomiting.  Genitourinary: Negative for dysuria.  Musculoskeletal: Positive for back pain and myalgias.  Skin: Negative for itching and rash.  Neurological: Positive for headaches. Negative for dizziness.  Psychiatric/Behavioral: Negative for depression, hallucinations, substance abuse and suicidal ideas. The patient is not nervous/anxious.    Maternal Medical History:  Reason for admission: Contractions.  Nausea.  Contractions: Onset was 3-5 hours ago.   Frequency: irregular.   Perceived severity is moderate.    Fetal activity: Perceived fetal activity is normal.   Last perceived fetal movement was within the past hour.    Prenatal complications: no prenatal complications Prenatal Complications - Diabetes: none.    Dilation: 4 Effacement (%): 50 Exam by:: Dr. Terri Piedra Blood pressure 118/74, pulse (!) 135, temperature 97.5 F (36.4 C), temperature source Oral, resp. rate 18, SpO2 99 %, unknown if currently breastfeeding. Maternal Exam:  Uterine Assessment: Contraction strength is moderate.  Contraction frequency is irregular.   Abdomen: Patient reports generalized tenderness.  Fetal presentation: vertex  Introitus: Normal vulva. Normal vagina.  Ferning test: negative.   Pelvis: adequate for delivery.   Cervix: Cervix evaluated by digital exam.     Physical Exam  Constitutional: She is oriented to person, place, and time. She appears well-developed.  Eyes: Pupils are equal, round, and reactive to light.  Neck: Normal range of motion.  Cardiovascular: Normal rate.   Respiratory: Effort normal.  GI: There is generalized tenderness.  Genitourinary:  Vagina normal and uterus normal.  Musculoskeletal: Normal range of motion. She exhibits edema and tenderness.  Neurological: She is alert and oriented to person, place, and time.  Skin: Skin is warm.   Psychiatric: She has a normal mood and affect. Her behavior is normal. Judgment and thought content normal.    Prenatal labs: ABO, Rh:   Antibody:   Rubella:   RPR:    HBsAg:    HIV:    GBS:     Assessment/Plan: G3P2002 at 37 5/7wks in active labor Recheck in two hours; augment if indicated IV meds/nitrous gas/epidural prn Anticipate svd  Raven Roberts 11/22/2016, 8:00 PM

## 2016-11-22 NOTE — MAU Note (Signed)
Spoke with L&D charge RN-will call back with room assignment

## 2016-11-23 ENCOUNTER — Inpatient Hospital Stay (HOSPITAL_COMMUNITY): Payer: Medicaid Other | Admitting: Anesthesiology

## 2016-11-23 ENCOUNTER — Encounter (HOSPITAL_COMMUNITY): Payer: Self-pay

## 2016-11-23 LAB — CBC
HEMATOCRIT: 33.1 % — AB (ref 36.0–46.0)
HEMOGLOBIN: 11.3 g/dL — AB (ref 12.0–15.0)
MCH: 29.9 pg (ref 26.0–34.0)
MCHC: 34.1 g/dL (ref 30.0–36.0)
MCV: 87.6 fL (ref 78.0–100.0)
Platelets: 184 10*3/uL (ref 150–400)
RBC: 3.78 MIL/uL — ABNORMAL LOW (ref 3.87–5.11)
RDW: 14.4 % (ref 11.5–15.5)
WBC: 12 10*3/uL — ABNORMAL HIGH (ref 4.0–10.5)

## 2016-11-23 LAB — RPR: RPR: NONREACTIVE

## 2016-11-23 MED ORDER — PHENOL 1.4 % MT LIQD
1.0000 | OROMUCOSAL | Status: DC | PRN
  Administered 2016-11-23: 1 via OROMUCOSAL
  Filled 2016-11-23: qty 177

## 2016-11-23 MED ORDER — ONDANSETRON HCL 4 MG/2ML IJ SOLN: 4.0000 mg | INTRAMUSCULAR | Status: DC | PRN

## 2016-11-23 MED ORDER — PHENYLEPHRINE 40 MCG/ML (10ML) SYRINGE FOR IV PUSH (FOR BLOOD PRESSURE SUPPORT)
80.0000 ug | PREFILLED_SYRINGE | INTRAVENOUS | Status: DC | PRN
  Filled 2016-11-23: qty 10
  Filled 2016-11-23: qty 5

## 2016-11-23 MED ORDER — GUAIFENESIN ER 600 MG PO TB12
600.0000 mg | ORAL_TABLET | Freq: Two times a day (BID) | ORAL | Status: DC
  Administered 2016-11-23 – 2016-11-24 (×3): 600 mg via ORAL
  Filled 2016-11-23 (×5): qty 1

## 2016-11-23 MED ORDER — DIBUCAINE 1 % RE OINT: 1.0000 "application " | TOPICAL_OINTMENT | RECTAL | Status: DC | PRN

## 2016-11-23 MED ORDER — LACTATED RINGERS IV SOLN: 500.0000 mL | Freq: Once | INTRAVENOUS | Status: DC

## 2016-11-23 MED ORDER — LIDOCAINE HCL (PF) 1 % IJ SOLN
INTRAMUSCULAR | Status: DC | PRN
  Administered 2016-11-23: 2 mL
  Administered 2016-11-23: 5 mL
  Administered 2016-11-23: 3 mL

## 2016-11-23 MED ORDER — SIMETHICONE 80 MG PO CHEW: 80.0000 mg | CHEWABLE_TABLET | ORAL | Status: DC | PRN

## 2016-11-23 MED ORDER — PHENYLEPHRINE 40 MCG/ML (10ML) SYRINGE FOR IV PUSH (FOR BLOOD PRESSURE SUPPORT)
80.0000 ug | PREFILLED_SYRINGE | INTRAVENOUS | Status: DC | PRN
Start: 2016-11-23 — End: 2016-11-23

## 2016-11-23 MED ORDER — FENTANYL 2.5 MCG/ML BUPIVACAINE 1/10 % EPIDURAL INFUSION (WH - ANES)
14.0000 mL/h | INTRAMUSCULAR | Status: DC | PRN
  Administered 2016-11-23: 12 mL/h via EPIDURAL
  Filled 2016-11-23: qty 100

## 2016-11-23 MED ORDER — OXYCODONE HCL 5 MG PO TABS
5.0000 mg | ORAL_TABLET | Freq: Four times a day (QID) | ORAL | Status: DC | PRN
  Administered 2016-11-23: 5 mg via ORAL
  Filled 2016-11-23: qty 1

## 2016-11-23 MED ORDER — ZOLPIDEM TARTRATE 5 MG PO TABS: 5.0000 mg | ORAL_TABLET | Freq: Every evening | ORAL | Status: DC | PRN

## 2016-11-23 MED ORDER — DIPHENHYDRAMINE HCL 50 MG/ML IJ SOLN: 12.5000 mg | INTRAMUSCULAR | Status: DC | PRN

## 2016-11-23 MED ORDER — BENZOCAINE-MENTHOL 20-0.5 % EX AERO: 1.0000 "application " | INHALATION_SPRAY | CUTANEOUS | Status: DC | PRN

## 2016-11-23 MED ORDER — IBUPROFEN 600 MG PO TABS
600.0000 mg | ORAL_TABLET | Freq: Four times a day (QID) | ORAL | Status: DC
  Administered 2016-11-23 – 2016-11-24 (×6): 600 mg via ORAL
  Filled 2016-11-23 (×6): qty 1

## 2016-11-23 MED ORDER — COCONUT OIL OIL: 1.0000 "application " | TOPICAL_OIL | Status: DC | PRN

## 2016-11-23 MED ORDER — TETANUS-DIPHTH-ACELL PERTUSSIS 5-2.5-18.5 LF-MCG/0.5 IM SUSP: 0.5000 mL | Freq: Once | INTRAMUSCULAR | Status: DC

## 2016-11-23 MED ORDER — ACETAMINOPHEN 325 MG PO TABS
650.0000 mg | ORAL_TABLET | ORAL | Status: DC | PRN
  Administered 2016-11-23: 650 mg via ORAL
  Filled 2016-11-23: qty 2

## 2016-11-23 MED ORDER — SENNOSIDES-DOCUSATE SODIUM 8.6-50 MG PO TABS
2.0000 | ORAL_TABLET | ORAL | Status: DC
  Administered 2016-11-23: 2 via ORAL
  Filled 2016-11-23: qty 2

## 2016-11-23 MED ORDER — DIPHENHYDRAMINE HCL 25 MG PO CAPS: 25.0000 mg | ORAL_CAPSULE | Freq: Four times a day (QID) | ORAL | Status: DC | PRN

## 2016-11-23 MED ORDER — EPHEDRINE 5 MG/ML INJ
10.0000 mg | INTRAVENOUS | Status: DC | PRN
  Filled 2016-11-23: qty 4

## 2016-11-23 MED ORDER — PRENATAL MULTIVITAMIN CH
1.0000 | ORAL_TABLET | Freq: Every day | ORAL | Status: DC
  Filled 2016-11-23 (×2): qty 1

## 2016-11-23 MED ORDER — EPHEDRINE 5 MG/ML INJ: 10.0000 mg | INTRAVENOUS | Status: DC | PRN

## 2016-11-23 MED ORDER — ONDANSETRON HCL 4 MG PO TABS: 4.0000 mg | ORAL_TABLET | ORAL | Status: DC | PRN

## 2016-11-23 MED ORDER — WITCH HAZEL-GLYCERIN EX PADS: 1.0000 "application " | MEDICATED_PAD | CUTANEOUS | Status: DC | PRN

## 2016-11-23 NOTE — Anesthesia Postprocedure Evaluation (Signed)
Anesthesia Post Note  Patient: Raven Roberts  Procedure(s) Performed: * No procedures listed *  Patient location during evaluation: Mother Baby Anesthesia Type: Epidural Level of consciousness: awake, awake and alert, oriented and patient cooperative Pain management: pain level controlled Vital Signs Assessment: post-procedure vital signs reviewed and stable Respiratory status: spontaneous breathing, nonlabored ventilation and respiratory function stable Cardiovascular status: stable Postop Assessment: no headache, no backache, patient able to bend at knees and no signs of nausea or vomiting Anesthetic complications: no        Last Vitals:  Vitals:   11/23/16 0641 11/23/16 0742  BP: 140/72 129/61  Pulse: 86 97  Resp: 15   Temp: 37.5 C 37.1 C    Last Pain:  Vitals:   11/23/16 0741  TempSrc:   PainSc: 6    Pain Goal: Patients Stated Pain Goal: 3 (11/23/16 0030)               Kristin Lamagna L

## 2016-11-23 NOTE — Anesthesia Preprocedure Evaluation (Signed)
Anesthesia Evaluation  Patient identified by MRN, date of birth, ID band Patient awake    Reviewed: Allergy & Precautions, Patient's Chart, lab work & pertinent test results  Airway Mallampati: III       Dental   Pulmonary asthma , former smoker,    Pulmonary exam normal        Cardiovascular negative cardio ROS Normal cardiovascular exam     Neuro/Psych  Headaches,    GI/Hepatic negative GI ROS, Neg liver ROS,   Endo/Other  Morbid obesity  Renal/GU negative Renal ROS     Musculoskeletal   Abdominal   Peds  Hematology  (+) anemia ,   Anesthesia Other Findings   Reproductive/Obstetrics (+) Pregnancy                             Lab Results  Component Value Date   WBC 10.6 (H) 11/22/2016   HGB 11.0 (L) 11/22/2016   HCT 32.5 (L) 11/22/2016   MCV 88.1 11/22/2016   PLT 163 11/22/2016    Anesthesia Physical Anesthesia Plan  ASA: III  Anesthesia Plan: Epidural   Post-op Pain Management:    Induction:   Airway Management Planned: Natural Airway  Additional Equipment:   Intra-op Plan:   Post-operative Plan:   Informed Consent: I have reviewed the patients History and Physical, chart, labs and discussed the procedure including the risks, benefits and alternatives for the proposed anesthesia with the patient or authorized representative who has indicated his/her understanding and acceptance.     Plan Discussed with:   Anesthesia Plan Comments:         Anesthesia Quick Evaluation

## 2016-11-23 NOTE — Anesthesia Procedure Notes (Addendum)
Epidural Patient location during procedure: OB Start time: 11/23/2016 3:09 AM End time: 11/23/2016 3:19 AM  Staffing Anesthesiologist: Suzette Battiest Performed: anesthesiologist   Preanesthetic Checklist Completed: patient identified, site marked, surgical consent, pre-op evaluation, timeout performed, IV checked, risks and benefits discussed and monitors and equipment checked  Epidural Patient position: sitting Prep: site prepped and draped and DuraPrep Patient monitoring: continuous pulse ox and blood pressure Approach: midline Location: L4-L5 Injection technique: LOR air  Needle:  Needle type: Tuohy  Needle gauge: 17 G Needle length: 9 cm and 9 Needle insertion depth: 8 cm Catheter type: closed end flexible Catheter size: 19 Gauge Catheter at skin depth: 16 cm Test dose: negative  Assessment Events: blood not aspirated, injection not painful, no injection resistance, negative IV test and no paresthesia

## 2016-11-23 NOTE — Progress Notes (Signed)
UR chart review completed.  

## 2016-11-23 NOTE — Progress Notes (Addendum)
Post Partum Day 0 Subjective: no complaints, up ad lib, voiding, tolerating PO and nl lochia, pain controlled.  Some congestion, sore throat.  Objective: Blood pressure 129/61, pulse 97, temperature 98.7 F (37.1 C), resp. rate 15, height 5' 4.5" (1.638 m), weight 132.5 kg (292 lb), SpO2 98 %, unknown if currently breastfeeding.  Physical Exam:  General: alert and no distress Lochia: appropriate Uterine Fundus: firm   Recent Labs  11/22/16 2044  HGB 11.0*  HCT 32.5*    Assessment/Plan: Plan for discharge tomorrow or next day, Breastfeeding and Lactation consult.  Routine PP care.  Mucinex and chloraseptic prn.     LOS: 1 day   Bovard-Stuckert, Minda Faas 11/23/2016, 8:10 AM

## 2016-11-23 NOTE — Lactation Note (Signed)
This note was copied from a baby's chart. Lactation Consultation Note  Patient Name: Raven Roberts [id number] Date: 11/23/2016 Reason for consult: Initial assessment (baby Early term infant , 30 6/7 weeks , encouraged to call wi/ feeding cues )  Baby is 10 hours old and has been to the breast and latch x 2 10 - 30 mins, and since has been sleepy.  Since it has been almost 5 hours since the baby ate , and last time attempted has been a few hours.  LC assisted mom to place baby STS , baby awake and attempting to latch with assist. Baby fell asleep and LC left baby STS  On moms chest with pillow support under both arms.  LC reviewed hand expressing, small amount of colostrum noted. Also the importance of calling for latch score and documenting  Feedings, voids and stools, and latch scoring.  Mom aware to call with feeding cues for feeding assessment / Latch score.  Mother informed of post-discharge support and given phone number to the lactation department, including services for phone call assistance; out-patient appointments; and breastfeeding support group. List of other breastfeeding resources in the community given in the handout. Encouraged mother to call for problems or concerns related to breastfeeding.    Maternal Data Has patient been taught Hand Expression?: Yes Does the patient have breastfeeding experience prior to this delivery?: Yes  Feeding Feeding Type: Breast Fed Length of feed:  (baby attempted and fell asleep,STS )  LATCH Score/Interventions Latch: Too sleepy or reluctant, no latch achieved, no sucking elicited.  Audible Swallowing: None Intervention(s): Skin to skin  Type of Nipple: Everted at rest and after stimulation  Comfort (Breast/Nipple): Soft / non-tender     Hold (Positioning): Assistance needed to correctly position infant at breast and maintain latch. Intervention(s): Breastfeeding basics reviewed;Support Pillows;Position options;Skin to  skin  LATCH Score: 5  Lactation Tools Discussed/Used WIC Program: No (per mom , not with this baby , was with 1st 2 )   Consult Status Consult Status: Follow-up Date: 11/23/16 Follow-up type: In-patient    [place] 11/23/2016, 2:58 PM

## 2016-11-24 LAB — CULTURE, OB URINE

## 2016-11-24 MED ORDER — IBUPROFEN 600 MG PO TABS: 600.0000 mg | ORAL_TABLET | Freq: Four times a day (QID) | ORAL | 0 refills | Status: DC

## 2016-11-24 MED ORDER — OXYCODONE HCL 5 MG PO TABS: 5.0000 mg | ORAL_TABLET | Freq: Four times a day (QID) | ORAL | 0 refills | Status: DC | PRN

## 2016-11-24 NOTE — Discharge Summary (Signed)
OB Discharge Summary     Patient Name: Raven Roberts DOB: Dec 29, 1995 MRN: [id number]  Date of admission: 11/22/2016 Delivering MD: Carlynn Purl [facility]   Date of discharge: 11/24/2016  Admitting diagnosis: 71 WKS, WATER BROKE, LOW FM Intrauterine pregnancy: [redacted]w[redacted]d     Secondary diagnosis:  Active Problems:   Term pregnancy   SVD (spontaneous vaginal delivery)   Postpartum care following vaginal delivery      Discharge diagnosis: Term Pregnancy Delivered                                                                                                Post partum procedures:none  Augmentation: Pitocin  Complications: None  Hospital course:  Onset of Labor With Vaginal Delivery     21 y.o. yo [id number] at [redacted]w[redacted]d was admitted in Latent Labor on 11/22/2016. Patient had an uncomplicated labor course as follows:  Membrane Rupture Time/Date: 10:21 PM ,11/22/2016   Intrapartum Procedures: Episiotomy: None [1]                                         Lacerations:  1st degree [2]  Patient had a delivery of a Viable infant. 11/23/2016  Information for the patient's newborn:  Geena, Norrick [id number]  Delivery Method: Vaginal, Spontaneous Delivery (Filed from Delivery Summary)    Pateint had an uncomplicated postpartum course.  She is ambulating, tolerating a regular diet, passing flatus, and urinating well. Patient is discharged home in stable condition on 11/24/16.    Physical exam Vitals:   11/23/16 0742 11/23/16 1226 11/23/16 1800 11/24/16 0500  BP: 129/61 (!) 112/57 116/70 (!) 118/57  Pulse: 97 79 (!) 103 62  Resp:   18 18  Temp: 98.7 F (37.1 C) 98 F (36.7 C) 98 F (36.7 C) 97.5 F (36.4 C)  TempSrc:   Oral Oral  SpO2: 98% 100%    Weight:      Height:       General: alert and cooperative Lochia: appropriate Uterine Fundus: firm  Labs: Lab Results  Component Value Date   WBC 12.0 (H) 11/23/2016   HGB 11.3 (L) 11/23/2016   HCT 33.1 (L) 11/23/2016   MCV 87.6  11/23/2016   PLT 184 11/23/2016   CMP Latest Ref Rng & Units 07/03/2012  Glucose 70 - 99 mg/dL 88  BUN 6 - 23 mg/dL 8  Creatinine 0.47 - 1.00 mg/dL 0.51  Sodium 135 - 145 mEq/L 135  Potassium 3.5 - 5.1 mEq/L 4.3  Chloride 96 - 112 mEq/L 101  CO2 19 - 32 mEq/L 21  Calcium 8.4 - 10.5 mg/dL 9.5  Total Protein 6.0 - 8.3 g/dL 6.3  Total Bilirubin 0.3 - 1.2 mg/dL 0.2(L)  Alkaline Phos 47 - 119 U/L 218(H)  AST 0 - 37 U/L 12  ALT 0 - 35 U/L 8    Discharge instruction: per After Visit Summary and "Baby and Me Booklet".  After visit meds:  Allergies as of 11/24/2016  Reactions   Amoxicillin Anaphylaxis, Other (See Comments)   Has patient had a PCN reaction causing immediate rash, facial/tongue/throat swelling, SOB or lightheadedness with hypotension: Yes Has patient had a PCN reaction causing severe rash involving mucus membranes or skin necrosis: No Has patient had a PCN reaction that required hospitalization No Has patient had a PCN reaction occurring within the last 10 years: No If all of the above answers are "NO", then may proceed with Cephalosporin use.   Penicillins Anaphylaxis, Other (See Comments)   Has patient had a PCN reaction causing immediate rash, facial/tongue/throat swelling, SOB or lightheadedness with hypotension: Yes Has patient had a PCN reaction causing severe rash involving mucus membranes or skin necrosis: No Has patient had a PCN reaction that required hospitalization No Has patient had a PCN reaction occurring within the last 10 years: No If all of the above answers are "NO", then may proceed with Cephalosporin use.   Pineapple Anaphylaxis, Shortness Of Breath      Medication List    TAKE these medications   acetaminophen 500 MG tablet Commonly known as:  TYLENOL Take 500 mg by mouth every 6 (six) hours as needed for mild pain, moderate pain or headache.   ibuprofen 600 MG tablet Commonly known as:  ADVIL,MOTRIN Take 1 tablet (600 mg total) by mouth  every 6 (six) hours.   oxyCODONE 5 MG immediate release tablet Commonly known as:  Oxy IR/ROXICODONE Take 1 tablet (5 mg total) by mouth every 6 (six) hours as needed for moderate pain, severe pain or breakthrough pain.       Diet: routine diet  Activity: Advance as tolerated. Pelvic rest for 6 weeks.   Outpatient follow up:6 weeks Follow up Appt:No future appointments. Follow up Visit:No Follow-up on file.  Postpartum contraception: Nexplanon  Newborn Data: Live born female  Birth Weight: 7 lb 13 oz (3544 g) APGAR: 9, 9  Baby Feeding: Breast Disposition:home with mother   11/24/2016 Logan Bores, MD

## 2016-11-24 NOTE — Lactation Note (Signed)
This note was copied from a baby's chart. Lactation Consultation Note  Baby latched in football hold upon entering on R side.  Mother stated she was unsure of how much baby was getting. Discussed supply and demand.  Then had mother hand expressed on L side.  Numerous drops expressed. Mom encouraged to feed baby 8-12 times/24 hours and with feeding cues. Undress baby for feedings if baby is sleepy. Reviewed engorgement care and monitoring voids/stools. Provided mother w/ manual pump.  Encouraged her to post pump if she notices baby fall asleep during feedings and give volume back to baby.     Patient Name: Boy Kum Barnwell [id number] Date: 11/24/2016 Reason for consult: Follow-up assessment   Maternal Data    Feeding Feeding Type: Breast Fed Length of feed: 20 min  LATCH Score/Interventions Latch: Grasps breast easily, tongue down, lips flanged, rhythmical sucking.  Audible Swallowing: A few with stimulation Intervention(s): Hand expression  Type of Nipple: Everted at rest and after stimulation  Comfort (Breast/Nipple): Soft / non-tender     Hold (Positioning): No assistance needed to correctly position infant at breast.  LATCH Score: 9  Lactation Tools Discussed/Used     Consult Status Consult Status: Complete    Carlye Grippe 11/24/2016, 10:25 AM

## 2016-11-24 NOTE — Progress Notes (Signed)
Post Partum Day 1 Subjective: no complaints and tolerating PO.  Pt requesting early d/c.  Worried about milk supply, but states baby wetting diapers  Objective: Blood pressure (!) 118/57, pulse 62, temperature 97.5 F (36.4 C), temperature source Oral, resp. rate 18, height 5' 4.5" (1.638 m), weight 132.5 kg (292 lb), SpO2 100 %, unknown if currently breastfeeding.  Physical Exam:  General: alert and cooperative Lochia: appropriate Uterine Fundus: firm    Recent Labs  11/22/16 2044 11/23/16 1225  HGB 11.0* 11.3*  HCT 32.5* 33.1*    Assessment/Plan: Discharge home today if baby able to go Plans circumcision in the office Considering nexplanon   LOS: 2 days   Shrinika Blatz W 11/24/2016, 8:52 AM

## 2018-01-03 ENCOUNTER — Other Ambulatory Visit: Payer: Self-pay

## 2018-01-03 ENCOUNTER — Encounter (HOSPITAL_COMMUNITY): Payer: Self-pay

## 2018-01-03 ENCOUNTER — Emergency Department (HOSPITAL_COMMUNITY)
Admission: EM | Admit: 2018-01-03 | Discharge: 2018-01-03 | Disposition: A | Discharge status: Home / Self Care | Payer: Medicaid Other | Attending: Emergency Medicine | Admitting: Emergency Medicine

## 2018-01-03 DIAGNOSIS — J45909 Unspecified asthma, uncomplicated: Secondary | ICD-10-CM | POA: Insufficient documentation

## 2018-01-03 DIAGNOSIS — Z87891 Personal history of nicotine dependence: Secondary | ICD-10-CM | POA: Diagnosis not present

## 2018-01-03 DIAGNOSIS — R509 Fever, unspecified: Secondary | ICD-10-CM | POA: Diagnosis present

## 2018-01-03 DIAGNOSIS — J111 Influenza due to unidentified influenza virus with other respiratory manifestations: Secondary | ICD-10-CM | POA: Diagnosis not present

## 2018-01-03 DIAGNOSIS — R6889 Other general symptoms and signs: Secondary | ICD-10-CM

## 2018-01-03 DIAGNOSIS — Z79899 Other long term (current) drug therapy: Secondary | ICD-10-CM | POA: Insufficient documentation

## 2018-01-03 MED ORDER — GUAIFENESIN-CODEINE 100-10 MG/5ML PO SYRP: 5.0000 mL | ORAL_SOLUTION | Freq: Three times a day (TID) | ORAL | 0 refills | Status: DC | PRN

## 2018-01-03 MED ORDER — IBUPROFEN 400 MG PO TABS
600.0000 mg | ORAL_TABLET | Freq: Once | ORAL | Status: AC
  Administered 2018-01-03: 600 mg via ORAL
  Filled 2018-01-03: qty 1

## 2018-01-03 MED ORDER — ACETAMINOPHEN 325 MG PO TABS
650.0000 mg | ORAL_TABLET | Freq: Once | ORAL | Status: AC
  Administered 2018-01-03: 650 mg via ORAL
  Filled 2018-01-03: qty 2

## 2018-01-03 NOTE — ED Triage Notes (Signed)
Pt presents to the ed with complaints of flu like symptoms x 3 days. Pt states that she has had fevers, cough, congestion and body aches. nad in triage.

## 2018-01-03 NOTE — Discharge Instructions (Signed)
Please take Tylenol every 6 hours as needed for fever.  Take 600 mg ibuprofen every 6 hours as needed for body aches and headache.  I have written you a prescription for cough medicine which has codeine in it.  This medicine can make you drowsy so please do not drive, work or drink alcohol while taking it.  Please drink plenty of fluids and get rest.  I written you a work note.  The flu is contagious so please try to stay away from family members, especially those younger than 58 years old or older than 73.  Return to the emergency department if you have vomiting that will not stop, trouble breathing or have chest pain that worsens or become concerning in any way.

## 2018-01-03 NOTE — ED Notes (Signed)
EDP at bedside  

## 2018-01-03 NOTE — ED Notes (Signed)
Patient verbalized understanding of discharge instructions and denies any further needs or questions at this time. VS stable. Patient ambulatory with steady gait.  

## 2018-01-03 NOTE — ED Provider Notes (Signed)
[facility] EMERGENCY DEPARTMENT Provider Note   CSN: [id number] Arrival date & time: 01/03/18  1412     History   Chief Complaint Chief Complaint  Patient presents with  . Influenza    HPI Raven Roberts is a 22 y.o. female.  HPI  Raven Roberts is a 22yo female with a history of asthma and migraines who presents to the emergency department for evaluation of a fever, headaches, body aches, cough.  She reports that her symptoms began 3 days ago.  Denies known sick contacts.  States that she has had a tactile fever and chills.  Headache is bitemporal and described as "throbbing."  She denies associated visual disturbance, neck pain/stiffness, numbness, weakness, photophobia, nausea/vomiting.  States that she is also had generalized body aches.  Endorses a dry cough.  She reports anterior chest pain wall pain with cough or when people are hugging her around the chest.  Has tried taking over-the-counter Vicks for her symptoms.  Denies ear pain, sore throat, rhinorrhea, shortness of breath, abdominal pain, dysuria, diarrhea.  Is otherwise able to eat and drink normally.  Past Medical History:  Diagnosis Date  . Asthma   . Infection    uti, trich  . Migraines   . No pertinent past medical history   . Rectal pain, chronic     Patient Active Problem List   Diagnosis Date Noted  . SVD (spontaneous vaginal delivery) 11/23/2016  . Postpartum care following vaginal delivery 11/23/2016  . Term pregnancy 11/22/2016  . Penicillin allergy 02/28/2015  . Rubella non-immune status, antepartum 02/28/2015  . Fetal pyelectasis--right 02/28/2015  . Vaginal delivery 02/28/2015  . Maternal morbid obesity, antepartum ([place])   . Migraine without aura and without status migrainosus, not intractable 12/11/2014    Past Surgical History:  Procedure Laterality Date  . NO PAST SURGERIES      OB History    Gravida Para Term Preterm AB Living   3 3 3  0 0 3   SAB TAB Ectopic Multiple  Live Births   0 0 0 0 3       Home Medications    Prior to Admission medications   Medication Sig Start Date End Date Taking? Authorizing Provider  acetaminophen (TYLENOL) 500 MG tablet Take 500 mg by mouth every 6 (six) hours as needed for mild pain, moderate pain or headache.    [provider]  ibuprofen (ADVIL,MOTRIN) 600 MG tablet Take 1 tablet (600 mg total) by mouth every 6 (six) hours. 11/24/16   Paula Compton, MD  oxyCODONE (OXY IR/ROXICODONE) 5 MG immediate release tablet Take 1 tablet (5 mg total) by mouth every 6 (six) hours as needed for moderate pain, severe pain or breakthrough pain. 11/24/16   Paula Compton, MD    Family History Family History  Problem Relation Age of Onset  . Hypertension Mother   . Hypertension Father   . Diabetes Maternal Aunt   . Hypertension Maternal Aunt   . Diabetes Maternal Uncle   . Cancer Maternal Grandmother   . Hypertension Maternal Grandmother   . Cancer Maternal Grandfather        colon  . Hypertension Maternal Grandfather   . Stroke Maternal Grandfather     Social History Social History   Tobacco Use  . Smoking status: Former Smoker    Last attempt to quit: 01/08/2016    Years since quitting: 1.9  . Smokeless tobacco: Never Used  . Tobacco comment: quit with + preg  Substance Use Topics  . Alcohol use: No    Alcohol/week: 0.0 oz  . Drug use: Yes    Types: Marijuana    Comment: last use Oct 2015      Allergies   Amoxicillin; Penicillins; and Pineapple   Review of Systems Review of Systems  Constitutional: Positive for chills, fatigue and fever.  HENT: Negative for congestion, ear pain, rhinorrhea and sore throat.   Eyes: Negative for photophobia and visual disturbance.  Respiratory: Positive for cough. Negative for shortness of breath and wheezing.   Cardiovascular: Positive for chest pain (anterior chest pain with cough).  Gastrointestinal: Negative for abdominal pain, diarrhea, nausea and  vomiting.  Genitourinary: Negative for dysuria, frequency and hematuria.  Musculoskeletal: Positive for myalgias (total body). Negative for neck pain and neck stiffness.  Skin: Negative for rash.  Neurological: Positive for headaches. Negative for dizziness, weakness, light-headedness and numbness.     Physical Exam Updated Vital Signs BP (!) 141/91 (BP Location: Right Arm)   Pulse 98   Temp 99.7 F (37.6 C) (Oral)   Resp 18   Wt 132.5 kg (292 lb)   SpO2 100%   BMI 49.35 kg/m   Physical Exam  Constitutional: She is oriented to person, place, and time. She appears well-developed and well-nourished. No distress.  HENT:  Head: Normocephalic and atraumatic.  Bilateral TMs with good cone of light.  Mucous membranes moist.  Posterior oropharynx erythematous.  No tonsillar edema or exudate.  Uvula midline.  Airway patent.  Able to handle oral secretions.  Eyes: Conjunctivae are normal. Pupils are equal, round, and reactive to light. Right eye exhibits no discharge. Left eye exhibits no discharge.  Neck: Normal range of motion. Neck supple.  Cardiovascular: Normal rate, regular rhythm and intact distal pulses. Exam reveals no friction rub.  No murmur heard. Pulmonary/Chest: Effort normal and breath sounds normal. No stridor. No respiratory distress. She has no wheezes. She has no rales.  Anterior chest wall grossly tender to palpation. No overlying rash or ecchymosis.   Abdominal: Soft. Bowel sounds are normal. There is no tenderness. There is no guarding.  Musculoskeletal: Normal range of motion.  Lymphadenopathy:    She has no cervical adenopathy.  Neurological: She is alert and oriented to person, place, and time. Coordination normal.  Skin: Skin is warm and dry. She is not diaphoretic.  Psychiatric: She has a normal mood and affect. Her behavior is normal.  Nursing note and vitals reviewed.    ED Treatments / Results  Labs (all labs ordered are listed, but only abnormal  results are displayed) Labs Reviewed - No data to display  EKG  EKG Interpretation None       Radiology No results found.  Procedures Procedures (including critical care time)  Medications Ordered in ED Medications  acetaminophen (TYLENOL) tablet 650 mg (not administered)  ibuprofen (ADVIL,MOTRIN) tablet 600 mg (not administered)     Initial Impression / Assessment and Plan / ED Course  I have reviewed the triage vital signs and the nursing notes.  Pertinent labs & imaging results that were available during my care of the patient were reviewed by me and considered in my medical decision making (see chart for details).    Patient with symptoms consistent with influenza.  Vitals are stable, low-grade fever.  No signs of dehydration, tolerating PO's.  Lungs are clear.  Full neck range of motion without pain, no concern for meningitis. Due to patient's presentation and physical exam a chest x-ray was  not ordered bc likely diagnosis of flu.  Discussed the cost versus benefit of Tamiflu treatment with the patient. The patient understands that symptoms are greater than the recommended 24-48 hour window of treatment.  Patient will be discharged with instructions to orally hydrate, rest, and use over-the-counter medications such as anti-inflammatories ibuprofen and Aleve for muscle aches and Tylenol for fever.  Patient will also be given a cough suppressant.   Final Clinical Impressions(s) / ED Diagnoses   Final diagnoses:  Flu-like symptoms    ED Discharge Orders        Ordered    guaiFENesin-codeine [facility] AC) 100-10 MG/5ML syrup  3 times daily PRN     01/03/18 1726       Glyn Ade, PA-C 01/03/18 Trecia Rogers, MD 01/04/18 2015

## 2018-04-07 ENCOUNTER — Encounter (HOSPITAL_COMMUNITY): Payer: Self-pay | Admitting: *Deleted

## 2018-04-07 ENCOUNTER — Inpatient Hospital Stay (HOSPITAL_COMMUNITY): Payer: Medicaid Other

## 2018-04-07 ENCOUNTER — Inpatient Hospital Stay (HOSPITAL_COMMUNITY)
Admission: AD | Admit: 2018-04-07 | Discharge: 2018-04-07 | Disposition: A | Discharge status: Home / Self Care | Payer: Medicaid Other | Source: Ambulatory Visit | Attending: Obstetrics and Gynecology | Admitting: Obstetrics and Gynecology

## 2018-04-07 DIAGNOSIS — O26892 Other specified pregnancy related conditions, second trimester: Secondary | ICD-10-CM | POA: Insufficient documentation

## 2018-04-07 DIAGNOSIS — Z3A14 14 weeks gestation of pregnancy: Secondary | ICD-10-CM | POA: Diagnosis not present

## 2018-04-07 DIAGNOSIS — Z880 Allergy status to penicillin: Secondary | ICD-10-CM | POA: Insufficient documentation

## 2018-04-07 DIAGNOSIS — Z91018 Allergy to other foods: Secondary | ICD-10-CM | POA: Insufficient documentation

## 2018-04-07 DIAGNOSIS — M5441 Lumbago with sciatica, right side: Secondary | ICD-10-CM | POA: Diagnosis not present

## 2018-04-07 DIAGNOSIS — R109 Unspecified abdominal pain: Secondary | ICD-10-CM

## 2018-04-07 DIAGNOSIS — O9989 Other specified diseases and conditions complicating pregnancy, childbirth and the puerperium: Secondary | ICD-10-CM | POA: Diagnosis not present

## 2018-04-07 DIAGNOSIS — M549 Dorsalgia, unspecified: Secondary | ICD-10-CM | POA: Diagnosis present

## 2018-04-07 DIAGNOSIS — M5442 Lumbago with sciatica, left side: Secondary | ICD-10-CM | POA: Diagnosis not present

## 2018-04-07 DIAGNOSIS — Z8249 Family history of ischemic heart disease and other diseases of the circulatory system: Secondary | ICD-10-CM | POA: Diagnosis not present

## 2018-04-07 DIAGNOSIS — R1084 Generalized abdominal pain: Secondary | ICD-10-CM | POA: Diagnosis not present

## 2018-04-07 DIAGNOSIS — Z87891 Personal history of nicotine dependence: Secondary | ICD-10-CM | POA: Insufficient documentation

## 2018-04-07 DIAGNOSIS — O26891 Other specified pregnancy related conditions, first trimester: Secondary | ICD-10-CM

## 2018-04-07 DIAGNOSIS — O26899 Other specified pregnancy related conditions, unspecified trimester: Secondary | ICD-10-CM

## 2018-04-07 DIAGNOSIS — Z3492 Encounter for supervision of normal pregnancy, unspecified, second trimester: Secondary | ICD-10-CM

## 2018-04-07 LAB — CBC
HCT: 39 % (ref 36.0–46.0)
Hemoglobin: 13.2 g/dL (ref 12.0–15.0)
MCH: 29.5 pg (ref 26.0–34.0)
MCHC: 33.8 g/dL (ref 30.0–36.0)
MCV: 87.2 fL (ref 78.0–100.0)
PLATELETS: 199 10*3/uL (ref 150–400)
RBC: 4.47 MIL/uL (ref 3.87–5.11)
RDW: 14.2 % (ref 11.5–15.5)
WBC: 9.7 10*3/uL (ref 4.0–10.5)

## 2018-04-07 LAB — URINALYSIS, ROUTINE W REFLEX MICROSCOPIC
Bacteria, UA: NONE SEEN
Bilirubin Urine: NEGATIVE
Glucose, UA: NEGATIVE mg/dL
Hgb urine dipstick: NEGATIVE
Ketones, ur: 80 mg/dL — AB
Nitrite: NEGATIVE
PROTEIN: 30 mg/dL — AB
Specific Gravity, Urine: 1.027 (ref 1.005–1.030)
pH: 6 (ref 5.0–8.0)

## 2018-04-07 LAB — COMPREHENSIVE METABOLIC PANEL
ALT: 23 U/L (ref 14–54)
AST: 21 U/L (ref 15–41)
Albumin: 3.3 g/dL — ABNORMAL LOW (ref 3.5–5.0)
Alkaline Phosphatase: 69 U/L (ref 38–126)
Anion gap: 13 (ref 5–15)
BUN: 6 mg/dL (ref 6–20)
CHLORIDE: 103 mmol/L (ref 101–111)
CO2: 18 mmol/L — AB (ref 22–32)
Calcium: 9 mg/dL (ref 8.9–10.3)
Creatinine, Ser: 0.54 mg/dL (ref 0.44–1.00)
GFR calc non Af Amer: >60 mL/min (ref >60)
Glucose, Bld: 110 mg/dL — ABNORMAL HIGH (ref 65–99)
POTASSIUM: 3.5 mmol/L (ref 3.5–5.1)
SODIUM: 134 mmol/L — AB (ref 135–145)
Total Bilirubin: 0.4 mg/dL (ref 0.3–1.2)
Total Protein: 7.1 g/dL (ref 6.5–8.1)

## 2018-04-07 LAB — POCT PREGNANCY, URINE: Preg Test, Ur: POSITIVE — AB

## 2018-04-07 MED ORDER — LACTATED RINGERS IV SOLN
INTRAVENOUS | Status: DC
  Administered 2018-04-07: 20:00:00 via INTRAVENOUS

## 2018-04-07 MED ORDER — HYDROMORPHONE HCL 1 MG/ML IJ SOLN: 1.0000 mg | Freq: Once | INTRAMUSCULAR | Status: DC

## 2018-04-07 MED ORDER — LACTATED RINGERS IV BOLUS
500.0000 mL | Freq: Once | INTRAVENOUS | Status: AC
  Administered 2018-04-07: 500 mL via INTRAVENOUS

## 2018-04-07 MED ORDER — ACETAMINOPHEN-CODEINE #3 300-30 MG PO TABS
1.0000 | ORAL_TABLET | ORAL | Status: DC | PRN
  Administered 2018-04-07: 1 via ORAL
  Filled 2018-04-07: qty 1

## 2018-04-07 MED ORDER — ACETAMINOPHEN 325 MG PO TABS
650.0000 mg | ORAL_TABLET | Freq: Once | ORAL | Status: AC
  Administered 2018-04-07: 650 mg via ORAL
  Filled 2018-04-07: qty 2

## 2018-04-07 NOTE — MAU Note (Signed)
Pt had her doctors appointment yesterday. Had blood work done and pelvic exam. Stated every since she left the doctors office she has been hurting. Having lower back pain and "feels numb from the waist down.

## 2018-04-07 NOTE — MAU Provider Note (Addendum)
Patient Raven Roberts is a 22 y.o. [phone] At [redacted]w[redacted]d (she has irregular periods). She had a prenatal visit yesterday; prenatal labs were done but no ultrasound. She was told at that visit she was probably closer to 12 weeks, based on exam.    She is here with sudden onset of abdominal back pain that radiates to her legs and makes them feel numb. She also has abdominal pain.  History     CSN: [id number]  Arrival date and time: 04/07/18 1723   First Provider Initiated Contact with Patient 04/07/18 1843      Chief Complaint  Patient presents with  . Back Pain   Abdominal Pain  This is a new problem. The current episode started yesterday. The onset quality is sudden. The problem occurs constantly. The pain is located in the generalized abdominal region. The pain is at a severity of 9/10. The quality of the pain is cramping and sharp. Associated symptoms include a fever and nausea. Pertinent negatives include no diarrhea or dysuria. Nothing aggravates the pain. The pain is relieved by nothing.  Back Pain  This is a new problem. The current episode started yesterday. The problem occurs constantly. The quality of the pain is described as aching. Radiates to: radiates to her legs.  Associated symptoms include abdominal pain and a fever. Pertinent negatives include no dysuria.    OB History    Gravida  4   Para  3   Term  3   Preterm  0   AB  0   Living  3     SAB  0   TAB  0   Ectopic  0   Multiple  0   Live Births  3           Past Medical History:  Diagnosis Date  . Asthma   . Infection    uti, trich  . Migraines   . No pertinent past medical history   . Rectal pain, chronic     Past Surgical History:  Procedure Laterality Date  . NO PAST SURGERIES    . TOOTH EXTRACTION      Family History  Problem Relation Age of Onset  . Hypertension Mother   . Hypertension Father   . Diabetes Maternal Aunt   . Hypertension Maternal Aunt   . Diabetes Maternal  Uncle   . Cancer Maternal Grandmother   . Hypertension Maternal Grandmother   . Cancer Maternal Grandfather        colon  . Hypertension Maternal Grandfather   . Stroke Maternal Grandfather     Social History   Tobacco Use  . Smoking status: Former Smoker    Last attempt to quit: 01/08/2016    Years since quitting: 2.2  . Smokeless tobacco: Never Used  . Tobacco comment: quit with + preg  Substance Use Topics  . Alcohol use: Yes    Alcohol/week: 0.0 oz    Comment: Last alcohol 04/04/18  . Drug use: Yes    Types: Marijuana    Comment: last marijuana 04/05/18    Allergies:  Allergies  Allergen Reactions  . Amoxicillin Anaphylaxis and Other (See Comments)    Has patient had a PCN reaction causing immediate rash, facial/tongue/throat swelling, SOB or lightheadedness with hypotension: Yes Has patient had a PCN reaction causing severe rash involving mucus membranes or skin necrosis: No Has patient had a PCN reaction that required hospitalization No Has patient had a PCN reaction occurring within the last 10  years: No If all of the above answers are "NO", then may proceed with Cephalosporin use.  Marland Kitchen Penicillins Anaphylaxis and Other (See Comments)    Has patient had a PCN reaction causing immediate rash, facial/tongue/throat swelling, SOB or lightheadedness with hypotension: Yes Has patient had a PCN reaction causing severe rash involving mucus membranes or skin necrosis: No Has patient had a PCN reaction that required hospitalization No Has patient had a PCN reaction occurring within the last 10 years: No If all of the above answers are "NO", then may proceed with Cephalosporin use.  Marland Kitchen Pineapple Anaphylaxis and Shortness Of Breath    Medications Prior to Admission  Medication Sig Dispense Refill Last Dose  . acetaminophen (TYLENOL) 500 MG tablet Take 500 mg by mouth every 6 (six) hours as needed for mild pain, moderate pain or headache.   11/22/2016 at 1300  . guaiFENesin-codeine  (ROBITUSSIN AC) 100-10 MG/5ML syrup Take 5 mLs by mouth 3 (three) times daily as needed for cough. 120 mL 0   . ibuprofen (ADVIL,MOTRIN) 600 MG tablet Take 1 tablet (600 mg total) by mouth every 6 (six) hours. 30 tablet 0   . oxyCODONE (OXY IR/ROXICODONE) 5 MG immediate release tablet Take 1 tablet (5 mg total) by mouth every 6 (six) hours as needed for moderate pain, severe pain or breakthrough pain. 15 tablet 0     Review of Systems  Constitutional: Positive for fever.  Gastrointestinal: Positive for abdominal pain and nausea. Negative for diarrhea.  Genitourinary: Negative for dysuria.  Musculoskeletal: Positive for back pain.   Physical Exam   Blood pressure (!) 161/71, pulse (!) 107, temperature (!) 101.3 F (38.5 C), temperature source Oral, resp. rate (!) 22, height 5\' 4"  (1.626 m), weight 279 lb (126.6 kg), last menstrual period 12/13/2017, unknown if currently breastfeeding.  Physical Exam  Constitutional: She is oriented to person, place, and time. She appears well-developed and well-nourished.  Neck: Normal range of motion.  Respiratory: Effort normal.  GI: Soft. She exhibits no distension and no mass. There is tenderness. There is no rebound and no guarding.  Genitourinary: Vagina normal.  Genitourinary Comments: Tender on bimanual but no masses, no specific CMT or adnexal tenderness.   Musculoskeletal: Normal range of motion.  Neurological: She is alert and oriented to person, place, and time.  Skin: Skin is warm and dry.  Psychiatric: She has a normal mood and affect.    MAU Course  Procedures  MDM Discussed patient's exam and blood work with Dr. Terri Piedra, patient to have renal US and [place] for dating; consider CT if worrisome for appendicitis.   -Urine for culture  -STI testing not done as patient had office appointment yesterday  -Tylenol for pain; patient feels better.   -Initial BP elevated, now normotensive.   Care of patient turned over to [facility] at 2015.    Mervyn Skeeters Kooistra 04/07/2018, 7:58 PM   [place] results from 04/07/18:  US Ob Comp Less 14 Wks  Result Date: 04/07/2018 CLINICAL DATA:  Low back pain. Pregnant patient. Patient is 16 weeks and 3 days pregnant based on her last menstrual period. EXAM: OBSTETRIC <14 WK ULTRASOUND TECHNIQUE: Transabdominal ultrasound was performed for evaluation of the gestation as well as the maternal uterus and adnexal regions. COMPARISON:  None. FINDINGS: Intrauterine gestational sac: Single Yolk sac:  Not Visualized. Embryo:  Visualized. Cardiac Activity: Visualized. Heart Rate: 165 bpm CRL:   82.6 mm   14 w 1 d  [place] EDC: 10/05/2018 Subchorionic hemorrhage:  None visualized. Maternal uterus/adnexae: No uterine masses or abnormalities. Normal ovaries and adnexa. No pelvic free fluid. IMPRESSION: 1. Single live anterior pregnancy with a measured gestational age of [redacted] weeks and 1 day. 2. No evidence of a pregnancy complication. No emergent maternal abnormality. Electronically Signed   By: Lajean Manes M.D.   On: 04/07/2018 20:17   US Renal  Result Date: 04/07/2018 CLINICAL DATA:  Abdominal pain during pregnancy. EXAM: RENAL / URINARY TRACT ULTRASOUND COMPLETE COMPARISON:  None. FINDINGS: Right Kidney: Length: 12.8 cm. Echogenicity within normal limits. No mass or hydronephrosis visualized. Left Kidney: Length: 13.1 cm. Echogenicity within normal limits. No mass or hydronephrosis visualized. Bladder: Appears normal for degree of bladder distention. IMPRESSION: Normal study.  No cause for the patient's symptoms identified. Electronically Signed   By: Dorise Bullion III M.D   On: 04/07/2018 20:12   Renal US negative for renal stones  Urine culture pending   Pt reports decreased pain from 7/10 to 3/10 with LR fluids and Tylenol for pain. Temp down to normal after Tylenol treatment.   C/w Dr Terri Piedra. Okay to discharge home with follow up in the office for prenatal care. Increase amount of water to 8  bottles of water throughout the day- patient is currently drinking 2 bottles of water per day. Patient verbalizes understanding.   Assessment and Plan  Discharge home. Pt stable at time of discharge.  Make appointment to be seen to initiate prenatal care  Increase amount of water per day to at least 8 bottles of water  Will call patient if urine culture comes back positive  Safe medications during pregnancy discussed- Tylenol safe to take in pregnancy.  1. Acute bilateral low back pain with bilateral sciatica   2. Abdominal pain affecting pregnancy   3. Abdominal pain during pregnancy in first trimester   4. Back pain during pregnancy in second trimester   5. Normal intrauterine pregnancy on prenatal ultrasound in second trimester    Follow-up Information    [facility]. Schedule an appointment as soon as possible for a visit.   Why:  Make appointment to start prenatal care  Contact information: [address] [place] [address] [phone]          Allergies as of 04/07/2018      Reactions   Amoxicillin Anaphylaxis, Other (See Comments)   Has patient had a PCN reaction causing immediate rash, facial/tongue/throat swelling, SOB or lightheadedness with hypotension: Yes Has patient had a PCN reaction causing severe rash involving mucus membranes or skin necrosis: No Has patient had a PCN reaction that required hospitalization No Has patient had a PCN reaction occurring within the last 10 years: No If all of the above answers are "NO", then may proceed with Cephalosporin use.   Penicillins Anaphylaxis, Other (See Comments)   Has patient had a PCN reaction causing immediate rash, facial/tongue/throat swelling, SOB or lightheadedness with hypotension: Yes Has patient had a PCN reaction causing severe rash involving mucus membranes or skin necrosis: No Has patient had a PCN reaction that required hospitalization No Has patient had a PCN reaction  occurring within the last 10 years: No If all of the above answers are "NO", then may proceed with Cephalosporin use.   Pineapple Anaphylaxis, Shortness Of Breath      Medication List    STOP taking these medications   ibuprofen 600 MG tablet Commonly known as:  ADVIL,MOTRIN   oxyCODONE  5 MG immediate release tablet Commonly known as:  Oxy IR/ROXICODONE     TAKE these medications   acetaminophen 500 MG tablet Commonly known as:  TYLENOL Take 500 mg by mouth every 6 (six) hours as needed for mild pain, moderate pain or headache.   guaiFENesin-codeine 100-10 MG/5ML syrup Commonly known as:  ROBITUSSIN AC Take 5 mLs by mouth 3 (three) times daily as needed for cough.      Lajean Manes, CNM 04/07/18, 9:13 PM

## 2018-04-09 LAB — CULTURE, OB URINE

## 2018-04-18 ENCOUNTER — Encounter (HOSPITAL_COMMUNITY): Payer: Self-pay

## 2018-04-18 ENCOUNTER — Ambulatory Visit (HOSPITAL_COMMUNITY)
Admission: AD | Admit: 2018-04-18 | Discharge: 2018-04-18 | Disposition: A | Discharge status: Home / Self Care | Payer: Medicaid Other | Source: Ambulatory Visit | Attending: Obstetrics and Gynecology | Admitting: Obstetrics and Gynecology

## 2018-04-18 ENCOUNTER — Inpatient Hospital Stay (HOSPITAL_COMMUNITY): Payer: Medicaid Other

## 2018-04-18 ENCOUNTER — Other Ambulatory Visit: Payer: Self-pay

## 2018-04-18 ENCOUNTER — Encounter (HOSPITAL_COMMUNITY)
Admission: AD | Disposition: A | Discharge status: Home / Self Care | Payer: Self-pay | Source: Ambulatory Visit | Attending: Obstetrics and Gynecology

## 2018-04-18 ENCOUNTER — Inpatient Hospital Stay (HOSPITAL_COMMUNITY): Payer: Medicaid Other | Admitting: Certified Registered Nurse Anesthetist

## 2018-04-18 DIAGNOSIS — Z87891 Personal history of nicotine dependence: Secondary | ICD-10-CM | POA: Diagnosis not present

## 2018-04-18 DIAGNOSIS — Z823 Family history of stroke: Secondary | ICD-10-CM | POA: Diagnosis not present

## 2018-04-18 DIAGNOSIS — O021 Missed abortion: Secondary | ICD-10-CM | POA: Insufficient documentation

## 2018-04-18 DIAGNOSIS — O99352 Diseases of the nervous system complicating pregnancy, second trimester: Secondary | ICD-10-CM | POA: Insufficient documentation

## 2018-04-18 DIAGNOSIS — J45909 Unspecified asthma, uncomplicated: Secondary | ICD-10-CM | POA: Insufficient documentation

## 2018-04-18 DIAGNOSIS — Z833 Family history of diabetes mellitus: Secondary | ICD-10-CM | POA: Insufficient documentation

## 2018-04-18 DIAGNOSIS — Z3A15 15 weeks gestation of pregnancy: Secondary | ICD-10-CM | POA: Insufficient documentation

## 2018-04-18 DIAGNOSIS — O99210 Obesity complicating pregnancy, unspecified trimester: Secondary | ICD-10-CM

## 2018-04-18 DIAGNOSIS — Z809 Family history of malignant neoplasm, unspecified: Secondary | ICD-10-CM | POA: Insufficient documentation

## 2018-04-18 DIAGNOSIS — O99512 Diseases of the respiratory system complicating pregnancy, second trimester: Secondary | ICD-10-CM | POA: Diagnosis not present

## 2018-04-18 DIAGNOSIS — Z880 Allergy status to penicillin: Secondary | ICD-10-CM | POA: Insufficient documentation

## 2018-04-18 DIAGNOSIS — Z8249 Family history of ischemic heart disease and other diseases of the circulatory system: Secondary | ICD-10-CM | POA: Diagnosis not present

## 2018-04-18 DIAGNOSIS — G43909 Migraine, unspecified, not intractable, without status migrainosus: Secondary | ICD-10-CM | POA: Diagnosis not present

## 2018-04-18 DIAGNOSIS — Z91018 Allergy to other foods: Secondary | ICD-10-CM | POA: Diagnosis not present

## 2018-04-18 DIAGNOSIS — O039 Complete or unspecified spontaneous abortion without complication: Secondary | ICD-10-CM

## 2018-04-18 HISTORY — PX: DILATION AND EVACUATION: SHX1459

## 2018-04-18 LAB — CBC
HCT: 35.9 % — ABNORMAL LOW (ref 36.0–46.0)
Hemoglobin: 11.9 g/dL — ABNORMAL LOW (ref 12.0–15.0)
MCH: 28.8 pg (ref 26.0–34.0)
MCHC: 33.1 g/dL (ref 30.0–36.0)
MCV: 86.9 fL (ref 78.0–100.0)
PLATELETS: 267 10*3/uL (ref 150–400)
RBC: 4.13 MIL/uL (ref 3.87–5.11)
RDW: 13.8 % (ref 11.5–15.5)
WBC: 7.7 10*3/uL (ref 4.0–10.5)

## 2018-04-18 LAB — RAPID URINE DRUG SCREEN, HOSP PERFORMED
AMPHETAMINES: NOT DETECTED
BARBITURATES: NOT DETECTED
Benzodiazepines: NOT DETECTED
Cocaine: NOT DETECTED
Opiates: POSITIVE — AB
TETRAHYDROCANNABINOL: POSITIVE — AB

## 2018-04-18 LAB — TYPE AND SCREEN
ABO/RH(D): O POS
Antibody Screen: NEGATIVE

## 2018-04-18 SURGERY — DILATION AND EVACUATION, UTERUS
Anesthesia: Choice

## 2018-04-18 MED ORDER — HYDROMORPHONE HCL 1 MG/ML IJ SOLN
1.0000 mg | Freq: Once | INTRAMUSCULAR | Status: AC
  Administered 2018-04-18: 1 mg via INTRAVENOUS
  Filled 2018-04-18: qty 1

## 2018-04-18 MED ORDER — LIDOCAINE HCL (CARDIAC) PF 100 MG/5ML IV SOSY
PREFILLED_SYRINGE | INTRAVENOUS | Status: AC
  Filled 2018-04-18: qty 5

## 2018-04-18 MED ORDER — DEXAMETHASONE SODIUM PHOSPHATE 10 MG/ML IJ SOLN
INTRAMUSCULAR | Status: AC
  Filled 2018-04-18: qty 1

## 2018-04-18 MED ORDER — PROMETHAZINE HCL 25 MG/ML IJ SOLN: 6.2500 mg | INTRAMUSCULAR | Status: DC | PRN

## 2018-04-18 MED ORDER — LACTATED RINGERS IV SOLN
INTRAVENOUS | Status: DC
  Administered 2018-04-18: 125 mL/h via INTRAVENOUS

## 2018-04-18 MED ORDER — HYDROMORPHONE HCL 1 MG/ML IJ SOLN: 0.2500 mg | INTRAMUSCULAR | Status: DC | PRN

## 2018-04-18 MED ORDER — SUCCINYLCHOLINE CHLORIDE 20 MG/ML IJ SOLN
INTRAMUSCULAR | Status: DC | PRN
  Administered 2018-04-18: 160 mg via INTRAVENOUS

## 2018-04-18 MED ORDER — SCOPOLAMINE 1 MG/3DAYS TD PT72
MEDICATED_PATCH | TRANSDERMAL | Status: AC
  Filled 2018-04-18: qty 1

## 2018-04-18 MED ORDER — SOD CITRATE-CITRIC ACID 500-334 MG/5ML PO SOLN
30.0000 mL | Freq: Once | ORAL | Status: AC
  Administered 2018-04-18: 30 mL via ORAL
  Filled 2018-04-18: qty 15

## 2018-04-18 MED ORDER — ROCURONIUM BROMIDE 100 MG/10ML IV SOLN
INTRAVENOUS | Status: AC
  Filled 2018-04-18: qty 1

## 2018-04-18 MED ORDER — FAMOTIDINE IN NACL 20-0.9 MG/50ML-% IV SOLN
20.0000 mg | Freq: Once | INTRAVENOUS | Status: AC
  Administered 2018-04-18: 20 mg via INTRAVENOUS
  Filled 2018-04-18: qty 50

## 2018-04-18 MED ORDER — SCOPOLAMINE 1 MG/3DAYS TD PT72
MEDICATED_PATCH | TRANSDERMAL | Status: DC | PRN
  Administered 2018-04-18: 1 via TRANSDERMAL

## 2018-04-18 MED ORDER — IBUPROFEN 200 MG PO TABS: 800.0000 mg | ORAL_TABLET | Freq: Three times a day (TID) | ORAL | 1 refills | Status: DC | PRN

## 2018-04-18 MED ORDER — MIDAZOLAM HCL 2 MG/2ML IJ SOLN
INTRAMUSCULAR | Status: AC
  Filled 2018-04-18: qty 2

## 2018-04-18 MED ORDER — MEPERIDINE HCL 25 MG/ML IJ SOLN: 6.2500 mg | INTRAMUSCULAR | Status: DC | PRN

## 2018-04-18 MED ORDER — PROPOFOL 10 MG/ML IV BOLUS
INTRAVENOUS | Status: DC | PRN
  Administered 2018-04-18: 200 mg via INTRAVENOUS

## 2018-04-18 MED ORDER — LIDOCAINE HCL 2 % IJ SOLN
INTRAMUSCULAR | Status: DC | PRN
  Administered 2018-04-18: 20 mL

## 2018-04-18 MED ORDER — ONDANSETRON HCL 4 MG/2ML IJ SOLN
INTRAMUSCULAR | Status: DC | PRN
  Administered 2018-04-18: 4 mg via INTRAVENOUS

## 2018-04-18 MED ORDER — PROPOFOL 10 MG/ML IV BOLUS
INTRAVENOUS | Status: AC
  Filled 2018-04-18: qty 20

## 2018-04-18 MED ORDER — MISOPROSTOL 200 MCG PO TABS
ORAL_TABLET | ORAL | Status: AC
  Filled 2018-04-18: qty 4

## 2018-04-18 MED ORDER — OXYCODONE HCL 5 MG PO TABS: 5.0000 mg | ORAL_TABLET | Freq: Four times a day (QID) | ORAL | 0 refills | Status: DC | PRN

## 2018-04-18 MED ORDER — MISOPROSTOL 200 MCG PO TABS
800.0000 ug | ORAL_TABLET | Freq: Once | ORAL | Status: AC
  Administered 2018-04-18: 800 ug via ORAL
  Filled 2018-04-18: qty 4

## 2018-04-18 MED ORDER — KETOROLAC TROMETHAMINE 30 MG/ML IJ SOLN
INTRAMUSCULAR | Status: AC
  Filled 2018-04-18: qty 1

## 2018-04-18 MED ORDER — KETOROLAC TROMETHAMINE 30 MG/ML IJ SOLN
INTRAMUSCULAR | Status: DC | PRN
  Administered 2018-04-18: 30 mg via INTRAVENOUS

## 2018-04-18 MED ORDER — MIDAZOLAM HCL 2 MG/2ML IJ SOLN
INTRAMUSCULAR | Status: DC | PRN
  Administered 2018-04-18: 2 mg via INTRAVENOUS

## 2018-04-18 MED ORDER — LIDOCAINE HCL (CARDIAC) PF 100 MG/5ML IV SOSY
PREFILLED_SYRINGE | INTRAVENOUS | Status: DC | PRN
  Administered 2018-04-18: 100 mg via INTRAVENOUS

## 2018-04-18 MED ORDER — LACTATED RINGERS IV SOLN
INTRAVENOUS | Status: DC | PRN
  Administered 2018-04-18: 14:00:00 via INTRAVENOUS

## 2018-04-18 MED ORDER — FENTANYL CITRATE (PF) 100 MCG/2ML IJ SOLN
INTRAMUSCULAR | Status: DC | PRN
  Administered 2018-04-18: 100 ug via INTRAVENOUS

## 2018-04-18 MED ORDER — LIDOCAINE HCL 2 % IJ SOLN
INTRAMUSCULAR | Status: AC
Start: 2018-04-18 — End: ?
  Filled 2018-04-18: qty 20

## 2018-04-18 MED ORDER — ACETAMINOPHEN 10 MG/ML IV SOLN: 1000.0000 mg | Freq: Once | INTRAVENOUS | Status: DC | PRN

## 2018-04-18 MED ORDER — MISOPROSTOL 100 MCG PO TABS
ORAL_TABLET | ORAL | Status: DC | PRN
  Administered 2018-04-18: 800 ug

## 2018-04-18 MED ORDER — GLYCOPYRROLATE 0.2 MG/ML IJ SOLN
INTRAMUSCULAR | Status: DC | PRN
  Administered 2018-04-18: 0.2 mg via INTRAVENOUS

## 2018-04-18 MED ORDER — GENTAMICIN SULFATE 40 MG/ML IJ SOLN
INTRAVENOUS | Status: AC
  Administered 2018-04-18: 417.5 mg via INTRAVENOUS
  Filled 2018-04-18: qty 10.5

## 2018-04-18 MED ORDER — FENTANYL CITRATE (PF) 250 MCG/5ML IJ SOLN
INTRAMUSCULAR | Status: AC
  Filled 2018-04-18: qty 5

## 2018-04-18 MED ORDER — DEXAMETHASONE SODIUM PHOSPHATE 10 MG/ML IJ SOLN
INTRAMUSCULAR | Status: DC | PRN
  Administered 2018-04-18: 10 mg via INTRAVENOUS

## 2018-04-18 MED ORDER — HYDROCODONE-ACETAMINOPHEN 7.5-325 MG PO TABS
1.0000 | ORAL_TABLET | Freq: Once | ORAL | Status: DC | PRN
Start: 2018-04-18 — End: 2018-04-18

## 2018-04-18 MED ORDER — ONDANSETRON HCL 4 MG/2ML IJ SOLN
INTRAMUSCULAR | Status: AC
  Filled 2018-04-18: qty 2

## 2018-04-18 MED ORDER — GLYCOPYRROLATE 0.2 MG/ML IJ SOLN
INTRAMUSCULAR | Status: AC
  Filled 2018-04-18: qty 1

## 2018-04-18 SURGICAL SUPPLY — 18 items
CATH ROBINSON RED A/P 16FR (CATHETERS) ×3 IMPLANT
DECANTER SPIKE VIAL GLASS SM (MISCELLANEOUS) ×3 IMPLANT
GLOVE BIO SURGEON STRL SZ 6.5 (GLOVE) ×2 IMPLANT
GLOVE BIO SURGEONS STRL SZ 6.5 (GLOVE) ×1
GLOVE BIOGEL PI IND STRL 7.0 (GLOVE) ×1 IMPLANT
GLOVE BIOGEL PI INDICATOR 7.0 (GLOVE) ×2
GOWN STRL REUS W/TWL LRG LVL3 (GOWN DISPOSABLE) ×6 IMPLANT
KIT BERKELEY 1ST TRIMESTER 3/8 (MISCELLANEOUS) ×3 IMPLANT
NS IRRIG 1000ML POUR BTL (IV SOLUTION) ×3 IMPLANT
PACK VAGINAL MINOR WOMEN LF (CUSTOM PROCEDURE TRAY) ×3 IMPLANT
PAD OB MATERNITY 4.3X12.25 (PERSONAL CARE ITEMS) ×3 IMPLANT
PAD PREP 24X48 CUFFED NSTRL (MISCELLANEOUS) ×3 IMPLANT
SET BERKELEY SUCTION TUBING (SUCTIONS) ×3 IMPLANT
TOWEL OR 17X24 6PK STRL BLUE (TOWEL DISPOSABLE) ×6 IMPLANT
VACURETTE 10 RIGID CVD (CANNULA) IMPLANT
VACURETTE 7MM CVD STRL WRAP (CANNULA) IMPLANT
VACURETTE 8 RIGID CVD (CANNULA) ×2 IMPLANT
VACURETTE 9 RIGID CVD (CANNULA) IMPLANT

## 2018-04-18 NOTE — Brief Op Note (Signed)
04/18/2018  2:39 PM  PATIENT:  Ruthe Mannan  22 y.o. female  PRE-OPERATIVE DIAGNOSIS:  15wk SVD, RPOC  POST-OPERATIVE DIAGNOSIS:  15wk SVD, RPOC  PROCEDURE:  Procedure(s): DILATATION AND EVACUATION (N/A) w US guidance  SURGEON:  Surgeon(s) and Role:    * Bovard-Stuckert, Starnisha Batrez, MD - Primary  ANESTHESIA:   local and general  EBL:  200cc, IVF and uop per anesthesia   BLOOD ADMINISTERED:none  DRAINS: none   LOCAL MEDICATIONS USED:  MARCAINE     SPECIMEN:  Source of Specimen:  POC  DISPOSITION OF SPECIMEN:  PATHOLOGY  COUNTS:  YES  TOURNIQUET:  * No tourniquets in log *  DICTATION: .Other Dictation: Dictation Number [id number]  PLAN OF CARE: Discharge to home after PACU  PATIENT DISPOSITION:  PACU - hemodynamically stable.   Delay start of Pharmacological VTE agent (>24hrs) due to surgical blood loss or risk of bleeding: yes

## 2018-04-18 NOTE — H&P (Signed)
Raven Roberts is an 22 y.o. female [phone] at 15+ with SROM and delivery of previable infant this am.  Had SROM, then had VB and "baby plopped out"  Pt feels delivered placenta - but trailing membranes and cord at os per CNM.  D/W pt poss need for D&C.  Was to start [facility] with confirm and [place] Thursday.  Rh + on review of records.    Pertinent Gynecological History: G4 P3003 TSVD x 3, no complications G4 was seen in MAU w pressure end of May  No pap +SDT - Chl and trich  Menstrual History:  Patient's last menstrual period was 12/13/2017 (approximate).    Past Medical History:  Diagnosis Date  . Asthma   . Infection    uti, trich  . Migraines   . No pertinent past medical history   . Rectal pain, chronic     Past Surgical History:  Procedure Laterality Date  . NO PAST SURGERIES    . TOOTH EXTRACTION      Family History  Problem Relation Age of Onset  . Hypertension Mother   . Hypertension Father   . Diabetes Maternal Aunt   . Hypertension Maternal Aunt   . Diabetes Maternal Uncle   . Cancer Maternal Grandmother   . Hypertension Maternal Grandmother   . Cancer Maternal Grandfather        colon  . Hypertension Maternal Grandfather   . Stroke Maternal Grandfather     Social History:  reports that she quit smoking about 2 years ago. She has never used smokeless tobacco. She reports that she drinks alcohol. She reports that she has current or past drug history. Drug: Marijuana. SAHM, single  Allergies:  Allergies  Allergen Reactions  . Amoxicillin Anaphylaxis and Other (See Comments)    Has patient had a PCN reaction causing immediate rash, facial/tongue/throat swelling, SOB or lightheadedness with hypotension: Yes Has patient had a PCN reaction causing severe rash involving mucus membranes or skin necrosis: No Has patient had a PCN reaction that required hospitalization No Has patient had a PCN reaction occurring within the last 10 years: No If all of the above  answers are "NO", then may proceed with Cephalosporin use.  Marland Kitchen Penicillins Anaphylaxis and Other (See Comments)    Has patient had a PCN reaction causing immediate rash, facial/tongue/throat swelling, SOB or lightheadedness with hypotension: Yes Has patient had a PCN reaction causing severe rash involving mucus membranes or skin necrosis: No Has patient had a PCN reaction that required hospitalization No Has patient had a PCN reaction occurring within the last 10 years: No If all of the above answers are "NO", then may proceed with Cephalosporin use.  Marland Kitchen Pineapple Anaphylaxis and Shortness Of Breath    Medications Prior to Admission  Medication Sig Dispense Refill Last Dose  . acetaminophen (TYLENOL) 500 MG tablet Take 500 mg by mouth every 6 (six) hours as needed for mild pain, moderate pain or headache.   11/22/2016 at 1300  . guaiFENesin-codeine (ROBITUSSIN AC) 100-10 MG/5ML syrup Take 5 mLs by mouth 3 (three) times daily as needed for cough. 120 mL 0     Review of Systems  Constitutional: Negative.   HENT: Negative.   Eyes: Negative.   Respiratory: Negative.   Cardiovascular: Negative.   Gastrointestinal: Positive for abdominal pain.       Pelvic pressure  Genitourinary: Negative.   Musculoskeletal:       Pelvic pressure  Skin: Negative.   Neurological: Negative.  Psychiatric/Behavioral: Negative.     Blood pressure 131/87, pulse (!) 120, temperature 98.5 F (36.9 C), temperature source Oral, resp. rate 18, last menstrual period 12/13/2017, SpO2 100 %, unknown if currently breastfeeding. Physical Exam  Constitutional: She is oriented to person, place, and time. She appears well-developed and well-nourished.  HENT:  Head: Normocephalic and atraumatic.  Cardiovascular: Normal rate and regular rhythm.  Respiratory: Effort normal and breath sounds normal. No respiratory distress. She has no wheezes.  GI: Soft. Bowel sounds are normal. There is no tenderness.  Musculoskeletal:  Normal range of motion.  Neurological: She is alert and oriented to person, place, and time.  Skin: Skin is warm and dry.  Psychiatric: She has a normal mood and affect. Her behavior is normal.    Results for orders placed or performed during the hospital encounter of 04/18/18 (from the past 24 hour(s))  CBC     Status: Abnormal   Collection Time: 04/18/18  8:41 AM  Result Value Ref Range   WBC 7.7 4.0 - 10.5 K/uL   RBC 4.13 3.87 - 5.11 MIL/uL   Hemoglobin 11.9 (L) 12.0 - 15.0 g/dL   HCT 35.9 (L) 36.0 - 46.0 %   MCV 86.9 78.0 - 100.0 fL   MCH 28.8 26.0 - 34.0 pg   MCHC 33.1 30.0 - 36.0 g/dL   RDW 13.8 11.5 - 15.5 %   Platelets 267 150 - 400 K/uL    No results found.  Assessment/Plan: 56CL [id number] with SVD of previable infant Cytotec 800mg  q 3hr x 2-3 Poss D&C Social support Close monitoring   Kimari Lienhard Bovard-Stuckert 04/18/2018, 9:18 AM

## 2018-04-18 NOTE — Anesthesia Procedure Notes (Signed)
Procedure Name: Intubation Date/Time: 04/18/2018 2:18 PM Performed by: Barnet Glasgow, MD Pre-anesthesia Checklist: Patient identified, Patient being monitored, Timeout performed, Emergency Drugs available and Suction available Patient Re-evaluated:Patient Re-evaluated prior to induction Oxygen Delivery Method: Circle System Utilized Preoxygenation: Pre-oxygenation with 100% oxygen Induction Type: IV induction Ventilation: Mask ventilation without difficulty Laryngoscope Size: Miller and 2 Grade View: Grade II Tube type: Oral Tube size: 7.0 mm Number of attempts: 1 Airway Equipment and Method: stylet Placement Confirmation: ETT inserted through vocal cords under direct vision,  positive ETCO2 and breath sounds checked- equal and bilateral Secured at: 23 cm Tube secured with: Tape Dental Injury: Teeth and Oropharynx as per pre-operative assessment

## 2018-04-18 NOTE — Transfer of Care (Signed)
Immediate Anesthesia Transfer of Care Note  Patient: Raven Roberts  Procedure(s) Performed: DILATATION AND EVACUATION WITH ULTRASOUND GUIDANCE (N/A )  Patient Location: PACU  Anesthesia Type:General  Level of Consciousness: awake, alert  and oriented  Airway & Oxygen Therapy: Patient Spontanous Breathing and Patient connected to nasal cannula oxygen  Post-op Assessment: Report given to RN, Post -op Vital signs reviewed and stable and Patient moving all extremities  Post vital signs: Reviewed and stable  Last Vitals:  Vitals Value Taken Time  BP 121/95 04/18/2018  2:55 PM  Temp 37.1 C 04/18/2018  2:54 PM  Pulse 86 04/18/2018  3:00 PM  Resp 13 04/18/2018  3:00 PM  SpO2 100 % 04/18/2018  3:00 PM  Vitals shown include unvalidated device data.  Last Pain:  Vitals:   04/18/18 1454  TempSrc:   PainSc: 0-No pain         Complications: No apparent anesthesia complications

## 2018-04-18 NOTE — MAU Note (Signed)
Pt states she was feeling pressure this am 0100 & noted SROM approx 0550, but was unsure if it was amniotic fluid.  She returned to bed & felt pressure again at 0630 & felt "a bubble pop" & noticed a large amt of vag bleeding, followed by pressure, & then "baby fell on the floor".  She states she passes placenta in toilet & flushed it.

## 2018-04-18 NOTE — Anesthesia Preprocedure Evaluation (Signed)
Anesthesia Evaluation  Patient identified by MRN, date of birth, ID band Patient awake    Reviewed: Allergy & Precautions, NPO status , Patient's Chart, lab work & pertinent test results  Airway Mallampati: III       Dental  (+) Dental Advisory Given, Teeth Intact   Pulmonary asthma , former smoker,    Pulmonary exam normal        Cardiovascular Exercise Tolerance: Good negative cardio ROS   Rhythm:Regular Rate:Normal     Neuro/Psych negative psych ROS   GI/Hepatic negative GI ROS, Neg liver ROS,   Endo/Other  Morbid obesity  Renal/GU      Musculoskeletal   Abdominal (+) + obese,   Peds  Hematology   Anesthesia Other Findings   Reproductive/Obstetrics negative OB ROS                             Lab Results  Component Value Date   WBC 7.7 04/18/2018   HGB 11.9 (L) 04/18/2018   HCT 35.9 (L) 04/18/2018   MCV 86.9 04/18/2018   PLT 267 04/18/2018   Lab Results  Component Value Date   CREATININE 0.54 04/07/2018   BUN 6 04/07/2018   NA 134 (L) 04/07/2018   K 3.5 04/07/2018   CL 103 04/07/2018   CO2 18 (L) 04/07/2018     Anesthesia Physical Anesthesia Plan  ASA: III  Anesthesia Plan: General   Post-op Pain Management:    Induction: Intravenous  PONV Risk Score and Plan: 3 and Treatment may vary due to age or medical condition  Airway Management Planned: Oral ETT  Additional Equipment:   Intra-op Plan:   Post-operative Plan: Extubation in OR  Informed Consent: I have reviewed the patients History and Physical, chart, labs and discussed the procedure including the risks, benefits and alternatives for the proposed anesthesia with the patient or authorized representative who has indicated his/her understanding and acceptance.   Dental advisory given  Plan Discussed with:   Anesthesia Plan Comments:         Anesthesia Quick Evaluation

## 2018-04-18 NOTE — MAU Note (Signed)
Called to assist pt from car, pt reports she started bleeding at home this am and passed the baby. Pt assisted to room one.

## 2018-04-18 NOTE — MAU Provider Note (Addendum)
History     CSN: [id number]  Arrival date and time: 04/18/18 0754   Chief Complaint  Patient presents with  . Miscarriage   [id number] @15 .5 wks here after SAB at home this am. Reports waking around 0500 and felt pressure. She went to the BR and voided, no bleeding present. Around 6am she felt more pressure and noticed she was bleeding when she got up and then passed the baby. She then went to the [facility] and thinks she passed the placenta. She reports a large amt of blood in the toilet after that. Having some uterine cramping now. States she felt fine before going to bed last night. Her pregnancy has been uncomplicated.   OB History    Gravida  4   Para  3   Term  3   Preterm  0   AB  1   Living  3     SAB  1   TAB  0   Ectopic  0   Multiple  0   Live Births  3           Past Medical History:  Diagnosis Date  . Asthma   . Infection    uti, trich  . Migraines   . No pertinent past medical history   . Rectal pain, chronic     Past Surgical History:  Procedure Laterality Date  . NO PAST SURGERIES    . TOOTH EXTRACTION      Family History  Problem Relation Age of Onset  . Hypertension Mother   . Hypertension Father   . Diabetes Maternal Aunt   . Hypertension Maternal Aunt   . Diabetes Maternal Uncle   . Cancer Maternal Grandmother   . Hypertension Maternal Grandmother   . Cancer Maternal Grandfather        colon  . Hypertension Maternal Grandfather   . Stroke Maternal Grandfather     Social History   Tobacco Use  . Smoking status: Former Smoker    Last attempt to quit: 01/08/2016    Years since quitting: 2.2  . Smokeless tobacco: Never Used  . Tobacco comment: quit with + preg  Substance Use Topics  . Alcohol use: Yes    Alcohol/week: 0.0 oz    Comment: Last alcohol 04/04/18  . Drug use: Yes    Types: Marijuana    Comment: last marijuana 04/05/18    Allergies:  Allergies  Allergen Reactions  . Amoxicillin Anaphylaxis and Other (See  Comments)    Has patient had a PCN reaction causing immediate rash, facial/tongue/throat swelling, SOB or lightheadedness with hypotension: Yes Has patient had a PCN reaction causing severe rash involving mucus membranes or skin necrosis: No Has patient had a PCN reaction that required hospitalization No Has patient had a PCN reaction occurring within the last 10 years: No If all of the above answers are "NO", then may proceed with Cephalosporin use.  Marland Kitchen Penicillins Anaphylaxis and Other (See Comments)    Has patient had a PCN reaction causing immediate rash, facial/tongue/throat swelling, SOB or lightheadedness with hypotension: Yes Has patient had a PCN reaction causing severe rash involving mucus membranes or skin necrosis: No Has patient had a PCN reaction that required hospitalization No Has patient had a PCN reaction occurring within the last 10 years: No If all of the above answers are "NO", then may proceed with Cephalosporin use.  Marland Kitchen Pineapple Anaphylaxis and Shortness Of Breath    Medications Prior to Admission  Medication Sig Dispense  Refill Last Dose  . acetaminophen (TYLENOL) 500 MG tablet Take 500 mg by mouth every 6 (six) hours as needed for mild pain, moderate pain or headache.   11/22/2016 at 1300  . guaiFENesin-codeine (ROBITUSSIN AC) 100-10 MG/5ML syrup Take 5 mLs by mouth 3 (three) times daily as needed for cough. 120 mL 0     Review of Systems  Constitutional: Negative for fever.  Gastrointestinal: Positive for abdominal pain.  Genitourinary: Positive for vaginal bleeding.   Physical Exam   Blood pressure 131/87, pulse (!) 120, temperature 98.5 F (36.9 C), temperature source Oral, resp. rate 18, last menstrual period 12/13/2017, SpO2 100 %, unknown if currently breastfeeding.  Physical Exam  Constitutional: She is oriented to person, place, and time. She appears well-developed and well-nourished. No distress.  HENT:  Head: Normocephalic and atraumatic.  Neck:  Normal range of motion.  Respiratory: Effort normal. No respiratory distress.  Genitourinary:  Genitourinary Comments: External: no lesions or erythema Vagina: rugated, pink, moist, large amt blood and clots filling speculum, vagina cleared, placenta seen protruding from os, removed with ring forceps, trailing membranes present    Musculoskeletal: Normal range of motion.  Neurological: She is alert and oriented to person, place, and time.  Skin: Skin is warm and dry.  Psychiatric: She has a normal mood and affect.   Results for orders placed or performed during the hospital encounter of 04/18/18 (from the past 24 hour(s))  Type and screen     Status: None   Collection Time: 04/18/18  8:40 AM  Result Value Ref Range   ABO/RH(D) O POS    Antibody Screen NEG    Sample Expiration      04/21/2018 Performed at [facility], [address], [place], [place] [address]   CBC     Status: Abnormal   Collection Time: 04/18/18  8:41 AM  Result Value Ref Range   WBC 7.7 4.0 - 10.5 K/uL   RBC 4.13 3.87 - 5.11 MIL/uL   Hemoglobin 11.9 (L) 12.0 - 15.0 g/dL   HCT 35.9 (L) 36.0 - 46.0 %   MCV 86.9 78.0 - 100.0 fL   MCH 28.8 26.0 - 34.0 pg   MCHC 33.1 30.0 - 36.0 g/dL   RDW 13.8 11.5 - 15.5 %   Platelets 267 150 - 400 K/uL  Urine rapid drug screen (hosp performed)     Status: Abnormal   Collection Time: 04/18/18 12:14 PM  Result Value Ref Range   Opiates POSITIVE (A) NONE DETECTED   Cocaine NONE DETECTED NONE DETECTED   Benzodiazepines NONE DETECTED NONE DETECTED   Amphetamines NONE DETECTED NONE DETECTED   Tetrahydrocannabinol POSITIVE (A) NONE DETECTED   Barbiturates NONE DETECTED NONE DETECTED   MAU Course  Procedures Cytotec 800 mcg  MDM Labs ordered and reviewed. Presentation, clinical findings, and plan discussed with Dr. Melba Coon. Cytotec ordered.  1215: no passage of placenta after Cytotec, EBL 800 mL, Dr. Melba Coon updated, [place] ordered. Plan for OR per Dr. Melba Coon  Assessment  and Plan  SAB Retained POCs Admit to OR Mngt per Dr. Mcneil Sober, CNM 04/18/2018, 8:28 AM

## 2018-04-18 NOTE — Interval H&P Note (Signed)
History and Physical Interval Note:  04/18/2018 2:39 PM  Raven Roberts  has presented today for surgery, with the diagnosis of missed ab  The various methods of treatment have been discussed with the patient and family. After consideration of risks, benefits and other options for treatment, the patient has consented to  Procedure(s): DILATATION AND EVACUATION (N/A) as a surgical intervention .  The patient's history has been reviewed, patient examined, no change in status, stable for surgery.  I have reviewed the patient's chart and labs.  Questions were answered to the patient's satisfaction.     Darlin Stenseth Bovard-Stuckert

## 2018-04-18 NOTE — MAU Provider Note (Signed)
   Pt s/p cytotec and 15wk delivery with heavy VB and RPOC.  D/W pt r/b/a of suction D&C will proceed

## 2018-04-18 NOTE — Op Note (Signed)
NAME: Raven Roberts, Raven Roberts MEDICAL RECORD [id number] ACCOUNT [id number] DATE OF BIRTH:05-06-1996 FACILITY: [facility] LOCATION: [facility] PHYSICIAN:Bryndle Corredor BOVARD-STUCKERT, MD  OPERATIVE REPORT  DATE OF PROCEDURE:  04/18/2018  PREOPERATIVE DIAGNOSIS:  Status post 15-week delivery with retained products of conception and bleeding.  POSTOPERATIVE DIAGNOSIS:  Status post 15-week delivery with retained products of conception and bleeding, status post suction dilatation and evacuation  PROCEDURE:  Dilatation and evacuation with ultrasound guidance.  SURGEON:   Janyth Contes, M.D.  ANESTHESIA:  Local and general.  ESTIMATED BLOOD LOSS:  200 mL.  IV FLUID, AND URINE OUTPUT:  Per anesthesia.  COMPLICATIONS:  None.  PATHOLOGY:  Products of conception.  DESCRIPTION OF PROCEDURE:  After informed consent was reviewed with the patient, she was transferred to the operating room and placed on the table in supine position, then placed in the [place] stirrups.  General anesthesia was induced and found to be  adequate.  She was then prepped and draped in the normal sterile fashion.  Her bladder was sterilely drained.  The ultrasound was performed revealing large amount of products of conception.  The cervix was difficult to visualize as there was so much  products of conception.  An open-sided speculum was used to visualize.  Tissue was removed.  Several passes in the uterus were used to remove tissue.  The uterine cavity was noted to be clear with ultrasound guidance.  The patient was awakened in stable  condition after instrumentation was removed from her vagina.    She was also given 800 of Cytotec rectally as she was having not unexpected vaginal bleeding.  AN/NUANCE  D:04/18/2018 T:04/18/2018 JOB:[id number]

## 2018-04-18 NOTE — Anesthesia Postprocedure Evaluation (Signed)
Anesthesia Post Note  Patient: Raven Roberts  Procedure(s) Performed: DILATATION AND EVACUATION WITH ULTRASOUND GUIDANCE (N/A )     Patient location during evaluation: PACU Anesthesia Type: General Level of consciousness: awake and alert Pain management: pain level controlled Vital Signs Assessment: post-procedure vital signs reviewed and stable Respiratory status: spontaneous breathing, nonlabored ventilation, respiratory function stable and patient connected to nasal cannula oxygen Cardiovascular status: blood pressure returned to baseline and stable Postop Assessment: no apparent nausea or vomiting Anesthetic complications: no    Last Vitals:  Vitals:   04/18/18 1545 04/18/18 1600  BP: 122/83   Pulse: 82 86  Resp: 12 15  Temp:  36.6 C  SpO2: 100% 99%    Last Pain:  Vitals:   04/18/18 1600  TempSrc:   PainSc: 0-No pain   Pain Goal:                 Barnet Glasgow

## 2018-04-18 NOTE — Discharge Instructions (Signed)

## 2018-04-18 NOTE — Progress Notes (Signed)
Attempted to visit patient upon learning about her miscarriage at home.  Was informed by pt's RN that she did not desire a visit from [place].  Please page as further needs arise.  Donald Prose. Elyn Peers, M.Div. [facility] Chaplain Pager [phone] Office [phone]

## 2018-04-19 ENCOUNTER — Encounter (HOSPITAL_COMMUNITY): Payer: Self-pay | Admitting: Obstetrics and Gynecology

## 2018-05-17 ENCOUNTER — Ambulatory Visit (HOSPITAL_COMMUNITY)
Admission: EM | Admit: 2018-05-17 | Discharge: 2018-05-17 | Disposition: A | Discharge status: Home / Self Care | Payer: Medicaid Other | Attending: Family Medicine | Admitting: Family Medicine

## 2018-05-17 ENCOUNTER — Encounter (HOSPITAL_COMMUNITY): Payer: Self-pay | Admitting: *Deleted

## 2018-05-17 ENCOUNTER — Other Ambulatory Visit: Payer: Self-pay

## 2018-05-17 DIAGNOSIS — R51 Headache: Secondary | ICD-10-CM | POA: Diagnosis not present

## 2018-05-17 DIAGNOSIS — J020 Streptococcal pharyngitis: Secondary | ICD-10-CM | POA: Diagnosis not present

## 2018-05-17 DIAGNOSIS — R509 Fever, unspecified: Secondary | ICD-10-CM

## 2018-05-17 DIAGNOSIS — J029 Acute pharyngitis, unspecified: Secondary | ICD-10-CM

## 2018-05-17 LAB — POCT RAPID STREP A: STREPTOCOCCUS, GROUP A SCREEN (DIRECT): POSITIVE — AB

## 2018-05-17 MED ORDER — CEFTRIAXONE SODIUM 250 MG IJ SOLR
INTRAMUSCULAR | Status: AC
  Filled 2018-05-17: qty 250

## 2018-05-17 MED ORDER — CEFTRIAXONE SODIUM 250 MG IJ SOLR
250.0000 mg | Freq: Once | INTRAMUSCULAR | Status: AC
  Administered 2018-05-17: 250 mg via INTRAMUSCULAR

## 2018-05-17 MED ORDER — CLINDAMYCIN HCL 300 MG PO CAPS: 300.0000 mg | ORAL_CAPSULE | Freq: Three times a day (TID) | ORAL | 0 refills | Status: DC

## 2018-05-17 MED ORDER — HYDROCODONE-ACETAMINOPHEN 7.5-325 MG/15ML PO SOLN: 10.0000 mL | ORAL | 0 refills | Status: DC | PRN

## 2018-05-17 NOTE — Discharge Instructions (Signed)
Strep throat.  This is contagious. You must take antibiotic 3 times a day for 10 days. Use the liquid Tylenol with hydrocodone as needed for pain. Salt water gargles. You may use Chloraseptic Spray or lozenges for pain. Return if worse, or if you are unable to keep down your medicines

## 2018-05-17 NOTE — ED Provider Notes (Signed)
[facility]    CSN: [id number] Arrival date & time: 05/17/18  0906     History   Chief Complaint Chief Complaint  Patient presents with  . Sore Throat    HPI Raven Roberts is a 22 y.o. female.   HPI  Severe sore throat since yesterday.  Is spitting into a cup.  She has headache.  Fever.  Decreased appetite.  She is able to keep down some Tylenol.  No runny or stuffy nose.  No known exposure to strep.  No cough or chest congestion.  She does have a listed allergy to penicillin/amoxicillin.  She states that caused a "bad rash" when she was a child.  No swelling of the lips or face, no difficulty breathing, no hospitalization reported.  Past Medical History:  Diagnosis Date  . Asthma   . Infection    uti, trich  . Migraines   . No pertinent past medical history   . Rectal pain, chronic     Patient Active Problem List   Diagnosis Date Noted  . SAB (spontaneous abortion)   . SVD (spontaneous vaginal delivery) 11/23/2016  . Postpartum care following vaginal delivery 11/23/2016  . Term pregnancy 11/22/2016  . Penicillin allergy 02/28/2015  . Rubella non-immune status, antepartum 02/28/2015  . Fetal pyelectasis--right 02/28/2015  . Vaginal delivery 02/28/2015  . Maternal morbid obesity, antepartum ([place])   . Migraine without aura and without status migrainosus, not intractable 12/11/2014    Past Surgical History:  Procedure Laterality Date  . DILATION AND EVACUATION N/A 04/18/2018   Procedure: DILATATION AND EVACUATION WITH ULTRASOUND GUIDANCE;  Surgeon: Janyth Contes, MD;  Location: [facility];  Service: Gynecology;  Laterality: N/A;  . NO PAST SURGERIES    . TOOTH EXTRACTION      OB History    Gravida  4   Para  3   Term  3   Preterm  0   AB  1   Living  3     SAB  1   TAB  0   Ectopic  0   Multiple  0   Live Births  3            Home Medications    Prior to Admission medications   Medication Sig Start Date End Date  Taking? Authorizing Provider  clindamycin (CLEOCIN) 300 MG capsule Take 1 capsule (300 mg total) by mouth 3 (three) times daily. 05/17/18   Raylene Everts, MD  HYDROcodone-acetaminophen (HYCET) 7.5-325 mg/15 ml solution Take 10 mLs by mouth every 4 (four) hours as needed for moderate pain. 05/17/18 05/17/19  Raylene Everts, MD  ibuprofen (ADVIL) 200 MG tablet Take 4 tablets (800 mg total) by mouth every 8 (eight) hours as needed for moderate pain. 04/18/18   Bovard-StuckertJeral Fruit, MD    Family History Family History  Problem Relation Age of Onset  . Hypertension Mother   . Hypertension Father   . Diabetes Maternal Aunt   . Hypertension Maternal Aunt   . Diabetes Maternal Uncle   . Cancer Maternal Grandmother   . Hypertension Maternal Grandmother   . Cancer Maternal Grandfather        colon  . Hypertension Maternal Grandfather   . Stroke Maternal Grandfather     Social History Social History   Tobacco Use  . Smoking status: Current Some Day Smoker    Last attempt to quit: 01/08/2016    Years since quitting: 2.3  . Smokeless tobacco: Never Used  .  Tobacco comment: quit with + preg  Substance Use Topics  . Alcohol use: Yes    Alcohol/week: 0.0 oz    Comment: Last alcohol 04/04/18  . Drug use: Not Currently    Types: Marijuana    Comment: last marijuana 04/05/18     Allergies   Pineapple; Amoxicillin; and Penicillins   Review of Systems Review of Systems  Constitutional: Negative for chills and fever.  HENT: Positive for sore throat and trouble swallowing. Negative for ear pain.   Eyes: Negative for pain and visual disturbance.  Respiratory: Negative for cough and shortness of breath.   Cardiovascular: Negative for chest pain and palpitations.  Gastrointestinal: Negative for abdominal pain and vomiting.  Genitourinary: Negative for dysuria and hematuria.  Musculoskeletal: Negative for arthralgias and back pain.  Skin: Negative for color change and rash.  Neurological:  Positive for headaches. Negative for seizures and syncope.  All other systems reviewed and are negative.    Physical Exam Triage Vital Signs ED Triage Vitals  Enc Vitals Group     BP 05/17/18 0915 (!) 143/54     Pulse Rate 05/17/18 0915 85     Resp 05/17/18 0915 18     Temp 05/17/18 0915 99.4 F (37.4 C)     Temp Source 05/17/18 0915 Oral     SpO2 05/17/18 0915 100 %     Weight --      Height --      Head Circumference --      Peak Flow --      Pain Score 05/17/18 0918 10     Pain Loc --      Pain Edu? --      Excl. in [place]? --    No data found.  Updated Vital Signs BP (!) 143/54 (BP Location: Right Arm)   Pulse 85   Temp 99.4 F (37.4 C) (Oral)   Resp 18   LMP 04/18/2018 (Exact Date)   SpO2 100%     Physical Exam  Constitutional: She appears well-developed and well-nourished. She appears ill. No distress.  Is uncomfortable  HENT:  Head: Normocephalic and atraumatic.  Right Ear: Hearing and tympanic membrane normal.  Left Ear: Hearing and tympanic membrane normal.  Mouth/Throat: Posterior oropharyngeal erythema present. No tonsillar abscesses. Tonsils are 3+ on the right. Tonsils are 3+ on the left. Tonsillar exudate.  Eyes: Pupils are equal, round, and reactive to light. Conjunctivae are normal.  Neck: Normal range of motion.  Cardiovascular: Normal rate and normal heart sounds.  Pulmonary/Chest: Effort normal and breath sounds normal. No respiratory distress.  Abdominal: Soft. She exhibits no distension.  Musculoskeletal: Normal range of motion. She exhibits no edema.  Lymphadenopathy:    She has cervical adenopathy.  Neurological: She is alert.  Skin: Skin is warm and dry.  Psychiatric: She has a normal mood and affect. Her behavior is normal.     UC Treatments / Results  Labs (all labs ordered are listed, but only abnormal results are displayed) Labs Reviewed  POCT RAPID STREP A - Abnormal; Notable for the following components:      Result Value    Streptococcus, Group A Screen (Direct) POSITIVE (*)    All other components within normal limits  CULTURE, GROUP A STREP [facility])    EKG None  Radiology No results found.  Procedures Procedures (including critical care time)  Medications Ordered in UC Medications  cefTRIAXone (ROCEPHIN) injection 250 mg (250 mg Intramuscular Given 05/17/18 1010)    Initial  Impression / Assessment and Plan / UC Course  I have reviewed the triage vital signs and the nursing notes.  Pertinent labs & imaging results that were available during my care of the patient were reviewed by me and considered in my medical decision making (see chart for details).     Discussed penicillin allergy.  I like to go the patient had an injection of antibiotic because she is unable to swallow well and is spitting into a cup.  I feel like this will get the infection improving sooner.  We discussed Bicillin but I am reluctant to do this with her penicillin allergy.  I feel like it is safe to give her a cephalosporin with a penicillin rash as a child. Final Clinical Impressions(s) / UC Diagnoses   Final diagnoses:  Strep throat     Discharge Instructions     Strep throat.  This is contagious. You must take antibiotic 3 times a day for 10 days. Use the liquid Tylenol with hydrocodone as needed for pain. Salt water gargles. You may use Chloraseptic Spray or lozenges for pain. Return if worse, or if you are unable to keep down your medicines   ED Prescriptions    Medication Sig Dispense Auth. Provider   clindamycin (CLEOCIN) 300 MG capsule Take 1 capsule (300 mg total) by mouth 3 (three) times daily. 30 capsule Raylene Everts, MD   HYDROcodone-acetaminophen (HYCET) 7.5-325 mg/15 ml solution Take 10 mLs by mouth every 4 (four) hours as needed for moderate pain. 60 mL Raylene Everts, MD     Controlled Substance Prescriptions [place] Controlled Substance Registry consulted? Yes, I have consulted the [place] Controlled  Substances Registry for this patient, and feel the risk/benefit ratio today is favorable for proceeding with this prescription for a controlled substance.   Raylene Everts, MD 05/17/18 2006

## 2018-05-17 NOTE — ED Triage Notes (Signed)
C/o sore thoat onset yest , states it hurts to swallow.

## 2018-07-14 ENCOUNTER — Inpatient Hospital Stay (HOSPITAL_COMMUNITY): Admission: RE | Admit: 2018-07-14 | Payer: Medicaid Other | Source: Ambulatory Visit

## 2018-07-14 NOTE — Patient Instructions (Signed)
Your procedure is scheduled on: Tuesday July 26, 2018 at 7:30 am  Enter through the Main Entrance of [facility] at: 6:00 am  Pick up the phone at the desk and dial [phone].  Call this number if you have problems the morning of surgery: [phone].  Remember: Do NOT eat food or drink any liquids after: Midnight on Monday September 16  Take these medicines the morning of surgery with a SIP OF WATER:  STOP ALL VITAMINS, SUPPLEMENTS, HERBAL MEDICATIONS NOW  DO NOT SMOKE DAY OF SURGERY  BRUSH YOUR TEETH DAY OF SURGERY  Do NOT wear jewelry (body piercing), metal hair clips/bobby pins, make-up, or nail polish. Do NOT wear lotions, powders, or perfumes.  You may wear deoderant. Do NOT shave for 48 hours prior to surgery. Do NOT bring valuables to the hospital. Contacts, dentures, or bridgework may not be worn into surgery.  Have a responsible adult drive you home and stay with you for 24 hours after your procedure

## 2018-07-15 ENCOUNTER — Encounter (HOSPITAL_COMMUNITY)
Admission: RE | Admit: 2018-07-15 | Discharge: 2018-07-15 | Disposition: A | Discharge status: Home / Self Care | Payer: Medicaid Other | Source: Ambulatory Visit | Attending: Obstetrics and Gynecology | Admitting: Obstetrics and Gynecology

## 2018-07-25 ENCOUNTER — Encounter (HOSPITAL_COMMUNITY): Payer: Self-pay | Admitting: Anesthesiology

## 2018-07-25 ENCOUNTER — Encounter (HOSPITAL_COMMUNITY): Payer: Self-pay | Admitting: Obstetrics and Gynecology

## 2018-07-25 DIAGNOSIS — Z302 Encounter for sterilization: Secondary | ICD-10-CM

## 2018-07-25 NOTE — H&P (Signed)
Raven Roberts is an 22 y.o. female [phone] with undesired fertility for tubal ligation by fulguration.  D/W pt process, r/b/a as well as risk of failure.  Pt voices understanding, wishes to proceed  Pertinent Gynecological History: OB History: G4, P3013 SVD x 3, female, female, female; 6#9-7#14 G4 15 wk loss, w D&C  No abn pap + STD - trich, Chl   Menstrual History:  No LMP recorded.    Past Medical History:  Diagnosis Date  . Admission for tubal ligation 07/25/2018  . Asthma   . Infection    uti, trich  . Migraines   . No pertinent past medical history   . Rectal pain, chronic     Past Surgical History:  Procedure Laterality Date  . DILATION AND EVACUATION N/A 04/18/2018   Procedure: DILATATION AND EVACUATION WITH ULTRASOUND GUIDANCE;  Surgeon: Janyth Contes, MD;  Location: [facility];  Service: Gynecology;  Laterality: N/A;  .     Marland Kitchen TOOTH EXTRACTION      Family History  Problem Relation Age of Onset  . Hypertension Mother   . Hypertension Father   . Diabetes Maternal Aunt   . Hypertension Maternal Aunt   . Diabetes Maternal Uncle   . Cancer Maternal Grandmother   . Hypertension Maternal Grandmother   . Cancer Maternal Grandfather        colon  . Hypertension Maternal Grandfather   . Stroke Maternal Grandfather     Social History:  reports that she has been smoking. She has never used smokeless tobacco. She reports that she drinks alcohol. She reports that she has current or past drug history. Drug: Marijuana. SAHM, single  Allergies:  Allergies  Allergen Reactions  . Pineapple Anaphylaxis and Shortness Of Breath  . Amoxicillin Rash and Other (See Comments)    Has patient had a PCN reaction causing immediate rash, facial/tongue/throat swelling, SOB or lightheadedness with hypotension: NO . Patient states rash as a child Has patient had a PCN reaction causing severe rash involving mucus membranes or skin necrosis: No Has patient had a PCN reaction that  required hospitalization No Has patient had a PCN reaction occurring within the last 10 years: No If all of the above answers are "NO", then may proceed with Cephalosporin use.  Marland Kitchen Penicillins Rash and Other (See Comments)    Has patient had a PCN reaction causing immediate rash, facial/tongue/throat swelling, SOB or lightheadedness with hypotension: NO.  Patient states rash as a child Has patient had a PCN reaction causing severe rash involving mucus membranes or skin necrosis: No Has patient had a PCN reaction that required hospitalization No Has patient had a PCN reaction occurring within the last 10 years: No If all of the above answers are "NO", then may proceed with Cephalosporin use.    No medications prior to admission.    Review of Systems  Constitutional: Negative.   HENT: Negative.   Eyes: Negative.   Respiratory: Negative.   Cardiovascular: Negative.   Gastrointestinal: Negative.   Genitourinary: Negative.   Musculoskeletal: Negative.   Skin: Negative.   Neurological: Negative.   Psychiatric/Behavioral: Negative.     unknown if currently breastfeeding. Physical Exam  Constitutional: She is oriented to person, place, and time. She appears well-developed and well-nourished.  HENT:  Head: Normocephalic and atraumatic.  Cardiovascular: Normal rate and regular rhythm.  Respiratory: Effort normal and breath sounds normal. No respiratory distress. She has no wheezes.  GI: Soft. Bowel sounds are normal. She exhibits no distension. There  is no tenderness.  Musculoskeletal: Normal range of motion.  Neurological: She is alert and oriented to person, place, and time.  Skin: Skin is warm and dry.  Psychiatric: She has a normal mood and affect. Her behavior is normal.    Assessment/Plan: 22yo [id number] for BTL by fulguration D/w pt r/b/a Will proceed  Raven Roberts 07/25/2018, 9:00 AM

## 2018-07-25 NOTE — Anesthesia Preprocedure Evaluation (Deleted)
Anesthesia Evaluation  Patient identified by MRN, date of birth, ID band Patient awake    Reviewed: Allergy & Precautions, NPO status , Patient's Chart, lab work & pertinent test results  Airway        Dental   Pulmonary asthma , Current Smoker,           Cardiovascular Exercise Tolerance: Good negative cardio ROS       Neuro/Psych  Headaches,    GI/Hepatic   Endo/Other    Renal/GU Renal disease     Musculoskeletal   Abdominal   Peds  Hematology   Anesthesia Other Findings   Reproductive/Obstetrics                             Anesthesia Physical Anesthesia Plan  ASA: II  Anesthesia Plan: General   Post-op Pain Management:    Induction: Intravenous  PONV Risk Score and Plan: 3 and Treatment may vary due to age or medical condition, Dexamethasone and Ondansetron  Airway Management Planned: Oral ETT  Additional Equipment:   Intra-op Plan:   Post-operative Plan: Extubation in OR  Informed Consent: I have reviewed the patients History and Physical, chart, labs and discussed the procedure including the risks, benefits and alternatives for the proposed anesthesia with the patient or authorized representative who has indicated his/her understanding and acceptance.   Dental advisory given  Plan Discussed with:   Anesthesia Plan Comments:         Anesthesia Quick Evaluation

## 2018-07-26 ENCOUNTER — Ambulatory Visit (HOSPITAL_COMMUNITY)
Admission: RE | Admit: 2018-07-26 | Payer: Medicaid Other | Source: Ambulatory Visit | Admitting: Obstetrics and Gynecology

## 2018-07-26 ENCOUNTER — Encounter (HOSPITAL_COMMUNITY): Admission: RE | Payer: Self-pay | Source: Ambulatory Visit

## 2018-07-26 HISTORY — DX: Encounter for sterilization: Z30.2

## 2018-07-26 SURGERY — LIGATION, FALLOPIAN TUBE, LAPAROSCOPIC
Anesthesia: Choice | Laterality: Bilateral

## 2018-11-09 NOTE — L&D Delivery Note (Signed)
Delivery Note Pt with uncontrollable urge to push shortly after epidural. Pt pushed once and at 12:53 AM a viable female was delivered via Vaginal, Spontaneous (Presentation:LOA ;  ).  APGAR: 9 ,9 ; weight pending .   Placenta status: delivered, intact, schultz  Cord: 3vc with the following complications: none.  Cord pH: n/a  Anesthesia: Epidural  Episiotomy: None Lacerations: None Suture Repair: n/a Est. Blood Loss (mL): 40  Mom to postpartum.  Baby to Couplet care / Skin to Skin. Pt desires circumcision in office  Raven Roberts 08/04/2019, 1:10 AM

## 2018-12-28 ENCOUNTER — Ambulatory Visit: Payer: Medicaid Other | Admitting: Family Medicine

## 2019-01-11 ENCOUNTER — Inpatient Hospital Stay (HOSPITAL_COMMUNITY)
Admission: AD | Admit: 2019-01-11 | Discharge: 2019-01-11 | Discharge status: Absent without leave | Payer: Medicaid Other | Attending: Obstetrics and Gynecology | Admitting: Obstetrics and Gynecology

## 2019-01-15 ENCOUNTER — Other Ambulatory Visit: Payer: Self-pay

## 2019-01-15 ENCOUNTER — Inpatient Hospital Stay (HOSPITAL_COMMUNITY)
Admission: AD | Admit: 2019-01-15 | Discharge: 2019-01-15 | Disposition: A | Discharge status: Home / Self Care | Payer: Medicaid Other | Attending: Obstetrics and Gynecology | Admitting: Obstetrics and Gynecology

## 2019-01-15 ENCOUNTER — Encounter (HOSPITAL_COMMUNITY): Payer: Self-pay

## 2019-01-15 DIAGNOSIS — Z3201 Encounter for pregnancy test, result positive: Secondary | ICD-10-CM

## 2019-01-15 LAB — URINALYSIS, ROUTINE W REFLEX MICROSCOPIC
BACTERIA UA: NONE SEEN
Bilirubin Urine: NEGATIVE
Glucose, UA: NEGATIVE mg/dL
HGB URINE DIPSTICK: NEGATIVE
Ketones, ur: NEGATIVE mg/dL
Nitrite: NEGATIVE
PROTEIN: NEGATIVE mg/dL
SPECIFIC GRAVITY, URINE: 1.027 (ref 1.005–1.030)
pH: 6 (ref 5.0–8.0)

## 2019-01-15 LAB — POCT PREGNANCY, URINE
PREG TEST UR: POSITIVE — AB
PREG TEST UR: POSITIVE — AB

## 2019-01-15 NOTE — Discharge Instructions (Signed)
[place] for [employer] at [facility]       Phone: [phone]  [place] for [employer] at [employer] Phone: [place] for [employer] at [place]  Phone: [place] for [place] at [employer]  Phone: [place] for [place] at [place]  Phone: [facility]       Phone: [phone]  [facility]    Phone: [phone]   [facility])    Phone: [facility]    Phone: [phone]  [facility]    Phone: [place]    Phone: [phone]  [place] [facility]       Phone: [phone]   [place] [facility]  Phone: [place]    Phone: [phone]  [facility]   Phone: [phone]  [facility]      Phone: [phone]  [facility]    Phone: [phone]  Safe Medications in Pregnancy   Acne: Benzoyl Peroxide Salicylic Acid  Backache/Headache: Tylenol: 2 regular strength every 4 hours OR              2 Extra strength every 6 hours  Colds/Coughs/Allergies: Benadryl (alcohol free) 25 mg every 6 hours as needed Breath right strips Claritin Cepacol throat lozenges Chloraseptic throat spray Cold-Eeze- up to three times per day Cough drops, alcohol free Flonase (by prescription only) Guaifenesin Mucinex Robitussin DM (plain only, alcohol free) Saline nasal spray/drops Sudafed (pseudoephedrine) & Actifed ** use only after [redacted] weeks gestation and if you do not have high blood pressure Tylenol Vicks Vaporub Zinc lozenges Zyrtec   Constipation: Colace Ducolax suppositories Fleet enema Glycerin suppositories Metamucil Milk of  magnesia Miralax Senokot Smooth move tea  Diarrhea: Kaopectate Imodium A-D  *NO pepto Bismol  Hemorrhoids: Anusol Anusol HC Preparation H Tucks  Indigestion: Tums Maalox Mylanta Zantac  Pepcid  Insomnia: Benadryl (alcohol free) 25mg  every 6 hours as needed Tylenol PM Unisom, no Gelcaps  Leg Cramps: Tums MagGel  Nausea/Vomiting:  Bonine Dramamine Emetrol Ginger extract Sea bands Meclizine  Nausea medication to take during pregnancy:  Unisom (doxylamine succinate 25 mg tablets) Take one tablet daily at bedtime. If symptoms are not adequately controlled, the dose can be increased to a maximum recommended dose of two tablets daily (1/2 tablet in the morning, 1/2 tablet mid-afternoon and one at bedtime). Vitamin B6 100mg  tablets. Take one tablet twice a day (up to 200 mg per day).  Skin Rashes: Aveeno products Benadryl cream or 25mg  every 6 hours as needed Calamine Lotion 1% cortisone cream  Yeast infection: Gyne-lotrimin 7 Monistat 7   **If taking multiple medications, please check labels to avoid duplicating the same active ingredients **take medication as directed on the label ** Do not exceed 4000 mg of tylenol in 24 hours **Do not take medications that contain aspirin or ibuprofen

## 2019-01-15 NOTE — MAU Note (Addendum)
Presents for pregnancy confirmation, reports has had 3 +HPT. Had lower abdominal cramping yesderday, no pain today. LMP mid January

## 2019-01-15 NOTE — MAU Provider Note (Signed)
Raven Roberts is a 23 y.o. [id number] at [redacted]w[redacted]d who presents to MAU today for pregnancy verification. The patient denies abdominal pain or vaginal bleeding today.   BP 112/62 (BP Location: Right Arm)   Pulse 84   Temp 97.7 F (36.5 C) (Oral)   Resp 20   Ht 5\' 4"  (1.626 m)   Wt 132.5 kg   LMP 11/23/2018   SpO2 100%   BMI 50.14 kg/m   CONSTITUTIONAL: Well-developed, well-nourished female in no acute distress.  ENT: External right and left ear normal.  EYES: EOM intact, conjunctivae normal.  MUSCULOSKELETAL: Normal range of motion.  CARDIOVASCULAR: Regular heart rate RESPIRATORY: Normal effort NEUROLOGICAL: Alert and oriented to person, place, and time.  SKIN: Skin is warm and dry. No rash noted. Not diaphoretic. No erythema. No pallor. PSYCH: Normal mood and affect. Normal behavior. Normal judgment and thought content.  Results for orders placed or performed during the hospital encounter of 01/15/19 (from the past 24 hour(s))  Pregnancy, urine POC     Status: Abnormal   Collection Time: 01/15/19  5:03 PM  Result Value Ref [place]   Preg Test, Ur POSITIVE (A) NEGATIVE    A: Positive pregnancy test  P: Discharge home Patient advised to start taking prenatal vitamins and encouraged patient to establish prenatal care as soon as possible First trimester warning signs reviewed Patient may return to MAU as needed or if her condition were to change or worsen   Wende Mott, [place]  01/15/2019 5:18 PM

## 2019-01-19 ENCOUNTER — Ambulatory Visit: Payer: Medicaid Other | Admitting: Family Medicine

## 2019-02-01 LAB — OB RESULTS CONSOLE RUBELLA ANTIBODY, IGM: Rubella: IMMUNE

## 2019-02-01 LAB — OB RESULTS CONSOLE HEPATITIS B SURFACE ANTIGEN: Hepatitis B Surface Ag: NEGATIVE

## 2019-02-01 LAB — OB RESULTS CONSOLE ABO/RH: RH Type: POSITIVE

## 2019-02-01 LAB — OB RESULTS CONSOLE GC/CHLAMYDIA
Chlamydia: NEGATIVE
Gonorrhea: NEGATIVE

## 2019-02-01 LAB — OB RESULTS CONSOLE RPR: RPR: NONREACTIVE

## 2019-02-01 LAB — OB RESULTS CONSOLE ANTIBODY SCREEN: Antibody Screen: NEGATIVE

## 2019-02-01 LAB — OB RESULTS CONSOLE HIV ANTIBODY (ROUTINE TESTING): HIV: NONREACTIVE

## 2019-07-24 LAB — OB RESULTS CONSOLE GBS: GBS: NEGATIVE

## 2019-07-25 ENCOUNTER — Telehealth (HOSPITAL_COMMUNITY): Payer: Self-pay | Admitting: *Deleted

## 2019-07-25 NOTE — Telephone Encounter (Signed)
Preadmission screen  

## 2019-07-27 ENCOUNTER — Telehealth (HOSPITAL_COMMUNITY): Payer: Self-pay | Admitting: *Deleted

## 2019-07-27 NOTE — Telephone Encounter (Signed)
Preadmission screen  

## 2019-07-28 ENCOUNTER — Telehealth (HOSPITAL_COMMUNITY): Payer: Self-pay | Admitting: *Deleted

## 2019-07-28 NOTE — Telephone Encounter (Signed)
Preadmission screen  

## 2019-07-31 ENCOUNTER — Telehealth (HOSPITAL_COMMUNITY): Payer: Self-pay | Admitting: *Deleted

## 2019-07-31 NOTE — Telephone Encounter (Signed)
Preadmission screen  

## 2019-08-01 ENCOUNTER — Other Ambulatory Visit: Payer: Self-pay

## 2019-08-01 ENCOUNTER — Ambulatory Visit (HOSPITAL_COMMUNITY)
Admission: RE | Admit: 2019-08-01 | Discharge: 2019-08-01 | Disposition: A | Discharge status: Home / Self Care | Payer: Medicaid Other | Source: Ambulatory Visit | Attending: Obstetrics and Gynecology | Admitting: Obstetrics and Gynecology

## 2019-08-01 DIAGNOSIS — Z20828 Contact with and (suspected) exposure to other viral communicable diseases: Secondary | ICD-10-CM | POA: Diagnosis not present

## 2019-08-01 DIAGNOSIS — Z01812 Encounter for preprocedural laboratory examination: Secondary | ICD-10-CM | POA: Diagnosis present

## 2019-08-01 LAB — SARS CORONAVIRUS 2 (TAT 6-24 HRS): SARS Coronavirus 2: NEGATIVE

## 2019-08-01 NOTE — MAU Note (Signed)
Asymptomatic, swab collected. 

## 2019-08-03 ENCOUNTER — Inpatient Hospital Stay (HOSPITAL_COMMUNITY)
Admission: AD | Admit: 2019-08-03 | Discharge: 2019-08-05 | DRG: 807 | Disposition: A | Discharge status: Home / Self Care | Payer: Medicaid Other | Attending: Obstetrics and Gynecology | Admitting: Obstetrics and Gynecology

## 2019-08-03 ENCOUNTER — Encounter (HOSPITAL_COMMUNITY): Payer: Self-pay | Admitting: *Deleted

## 2019-08-03 ENCOUNTER — Inpatient Hospital Stay (HOSPITAL_COMMUNITY): Payer: Medicaid Other

## 2019-08-03 DIAGNOSIS — O1493 Unspecified pre-eclampsia, third trimester: Secondary | ICD-10-CM | POA: Diagnosis present

## 2019-08-03 DIAGNOSIS — O99334 Smoking (tobacco) complicating childbirth: Secondary | ICD-10-CM | POA: Diagnosis present

## 2019-08-03 DIAGNOSIS — O9952 Diseases of the respiratory system complicating childbirth: Secondary | ICD-10-CM | POA: Diagnosis present

## 2019-08-03 DIAGNOSIS — F17210 Nicotine dependence, cigarettes, uncomplicated: Secondary | ICD-10-CM | POA: Diagnosis present

## 2019-08-03 DIAGNOSIS — O1404 Mild to moderate pre-eclampsia, complicating childbirth: Principal | ICD-10-CM | POA: Diagnosis present

## 2019-08-03 DIAGNOSIS — J45909 Unspecified asthma, uncomplicated: Secondary | ICD-10-CM | POA: Diagnosis present

## 2019-08-03 DIAGNOSIS — Z23 Encounter for immunization: Secondary | ICD-10-CM

## 2019-08-03 DIAGNOSIS — Z3A37 37 weeks gestation of pregnancy: Secondary | ICD-10-CM

## 2019-08-03 DIAGNOSIS — Z20828 Contact with and (suspected) exposure to other viral communicable diseases: Secondary | ICD-10-CM | POA: Diagnosis present

## 2019-08-03 DIAGNOSIS — O99214 Obesity complicating childbirth: Secondary | ICD-10-CM | POA: Diagnosis present

## 2019-08-03 LAB — CBC
HCT: 35.4 % — ABNORMAL LOW (ref 36.0–46.0)
Hemoglobin: 12.3 g/dL (ref 12.0–15.0)
MCH: 31.2 pg (ref 26.0–34.0)
MCHC: 34.7 g/dL (ref 30.0–36.0)
MCV: 89.8 fL (ref 80.0–100.0)
Platelets: 228 10*3/uL (ref 150–400)
RBC: 3.94 MIL/uL (ref 3.87–5.11)
RDW: 13.4 % (ref 11.5–15.5)
WBC: 10.9 10*3/uL — ABNORMAL HIGH (ref 4.0–10.5)
nRBC: 0 % (ref 0.0–0.2)

## 2019-08-03 LAB — PROTEIN / CREATININE RATIO, URINE
Creatinine, Urine: 559.81 mg/dL
Protein Creatinine Ratio: 0.04 mg/mg{Cre} (ref 0.00–0.15)
Total Protein, Urine: 25 mg/dL

## 2019-08-03 LAB — COMPREHENSIVE METABOLIC PANEL
ALT: 9 U/L (ref 0–44)
AST: 12 U/L — ABNORMAL LOW (ref 15–41)
Albumin: 2.8 g/dL — ABNORMAL LOW (ref 3.5–5.0)
Alkaline Phosphatase: 112 U/L (ref 38–126)
Anion gap: 10 (ref 5–15)
BUN: 8 mg/dL (ref 6–20)
CO2: 20 mmol/L — ABNORMAL LOW (ref 22–32)
Calcium: 9 mg/dL (ref 8.9–10.3)
Chloride: 105 mmol/L (ref 98–111)
Creatinine, Ser: 0.64 mg/dL (ref 0.44–1.00)
GFR calc Af Amer: >60 mL/min (ref >60)
GFR calc non Af Amer: >60 mL/min (ref >60)
Glucose, Bld: 110 mg/dL — ABNORMAL HIGH (ref 70–99)
Potassium: 3.5 mmol/L (ref 3.5–5.1)
Sodium: 135 mmol/L (ref 135–145)
Total Bilirubin: 0.2 mg/dL — ABNORMAL LOW (ref 0.3–1.2)
Total Protein: 6.2 g/dL — ABNORMAL LOW (ref 6.5–8.1)

## 2019-08-03 LAB — TYPE AND SCREEN
ABO/RH(D): O POS
Antibody Screen: NEGATIVE

## 2019-08-03 LAB — URIC ACID: Uric Acid, Serum: 5.7 mg/dL (ref 2.5–7.1)

## 2019-08-03 LAB — LACTATE DEHYDROGENASE: LDH: 101 U/L (ref 98–192)

## 2019-08-03 LAB — ABO/RH: ABO/RH(D): O POS

## 2019-08-03 MED ORDER — OXYCODONE-ACETAMINOPHEN 5-325 MG PO TABS: 1.0000 | ORAL_TABLET | ORAL | Status: DC | PRN

## 2019-08-03 MED ORDER — ACETAMINOPHEN 325 MG PO TABS: 650.0000 mg | ORAL_TABLET | ORAL | Status: DC | PRN

## 2019-08-03 MED ORDER — OXYTOCIN BOLUS FROM INFUSION
500.0000 mL | Freq: Once | INTRAVENOUS | Status: AC
  Administered 2019-08-04: 500 mL via INTRAVENOUS

## 2019-08-03 MED ORDER — PHENYLEPHRINE 40 MCG/ML (10ML) SYRINGE FOR IV PUSH (FOR BLOOD PRESSURE SUPPORT): 80.0000 ug | PREFILLED_SYRINGE | INTRAVENOUS | Status: DC | PRN

## 2019-08-03 MED ORDER — LACTATED RINGERS IV SOLN: 500.0000 mL | INTRAVENOUS | Status: DC | PRN

## 2019-08-03 MED ORDER — SOD CITRATE-CITRIC ACID 500-334 MG/5ML PO SOLN: 30.0000 mL | ORAL | Status: DC | PRN

## 2019-08-03 MED ORDER — EPHEDRINE 5 MG/ML INJ: 10.0000 mg | INTRAVENOUS | Status: DC | PRN

## 2019-08-03 MED ORDER — LACTATED RINGERS IV SOLN
500.0000 mL | Freq: Once | INTRAVENOUS | Status: AC
  Administered 2019-08-04: 500 mL via INTRAVENOUS

## 2019-08-03 MED ORDER — FENTANYL-BUPIVACAINE-NACL 0.5-0.125-0.9 MG/250ML-% EP SOLN
12.0000 mL/h | EPIDURAL | Status: DC | PRN
  Filled 2019-08-03: qty 250

## 2019-08-03 MED ORDER — TERBUTALINE SULFATE 1 MG/ML IJ SOLN: 0.2500 mg | Freq: Once | INTRAMUSCULAR | Status: DC | PRN

## 2019-08-03 MED ORDER — BUTORPHANOL TARTRATE 1 MG/ML IJ SOLN: 1.0000 mg | INTRAMUSCULAR | Status: DC | PRN

## 2019-08-03 MED ORDER — DIPHENHYDRAMINE HCL 50 MG/ML IJ SOLN: 12.5000 mg | INTRAMUSCULAR | Status: DC | PRN

## 2019-08-03 MED ORDER — OXYCODONE-ACETAMINOPHEN 5-325 MG PO TABS: 2.0000 | ORAL_TABLET | ORAL | Status: DC | PRN

## 2019-08-03 MED ORDER — OXYTOCIN 40 UNITS IN NORMAL SALINE INFUSION - SIMPLE MED
1.0000 m[IU]/min | INTRAVENOUS | Status: DC
  Administered 2019-08-03: 15:00:00 2 m[IU]/min via INTRAVENOUS
  Filled 2019-08-03: qty 1000

## 2019-08-03 MED ORDER — LIDOCAINE HCL (PF) 1 % IJ SOLN: 30.0000 mL | INTRAMUSCULAR | Status: DC | PRN

## 2019-08-03 MED ORDER — OXYTOCIN 40 UNITS IN NORMAL SALINE INFUSION - SIMPLE MED
2.5000 [IU]/h | INTRAVENOUS | Status: DC
  Administered 2019-08-04: 2.5 [IU]/h via INTRAVENOUS

## 2019-08-03 MED ORDER — ONDANSETRON HCL 4 MG/2ML IJ SOLN: 4.0000 mg | Freq: Four times a day (QID) | INTRAMUSCULAR | Status: DC | PRN

## 2019-08-03 MED ORDER — LACTATED RINGERS IV SOLN
INTRAVENOUS | Status: DC
  Administered 2019-08-03: 15:00:00 via INTRAVENOUS

## 2019-08-03 NOTE — H&P (Signed)
Raven Roberts is a 51 y.[id number] female presenting for iol at 65 1/7wks due to preE. Pt is dated per 11week [place]. She has had a prenatal course significant for preEclampsia. She is on labetalol 200mg  po bid with intermittent compliance. She is also obese ( Start BMI 51) and has a hisotry of migraines and asthma. She stopped use of tobacco when found was pregnant. She had negative essential panel and her panorama was low risk. She is GBS neg. Desires BTL - signed papers OB History    Gravida  5   Para  3   Term  3   Preterm  0   AB  1   Living  3     SAB  1   TAB  0   Ectopic  0   Multiple  0   Live Births  3          Past Medical History:  Diagnosis Date  . Admission for tubal ligation 07/25/2018  . Asthma   . Infection    uti, trich  . Migraines   . No pertinent past medical history   . Rectal pain, chronic    Past Surgical History:  Procedure Laterality Date  . DILATION AND EVACUATION N/A 04/18/2018   Procedure: DILATATION AND EVACUATION WITH ULTRASOUND GUIDANCE;  Surgeon: Janyth Contes, MD;  Location: [facility];  Service: Gynecology;  Laterality: N/A;  . NO PAST SURGERIES    . TOOTH EXTRACTION     Family History: family history includes Cancer in her maternal grandfather and maternal grandmother; Diabetes in her maternal aunt and maternal uncle; Hypertension in her father, maternal aunt, maternal grandfather, maternal grandmother, and mother; Stroke in her maternal grandfather. Social History:  reports that she has been smoking. She has never used smokeless tobacco. She reports current alcohol use. She reports previous drug use. Drug: Marijuana.     Maternal Diabetes: No Genetic Screening: Normal Maternal Ultrasounds/Referrals: Normal Fetal Ultrasounds or other Referrals:  None Maternal Substance Abuse:  No Significant Maternal Medications:  None Significant Maternal Lab Results:  Group B Strep negative Other Comments:  None  Review of Systems   Constitutional: Positive for malaise/fatigue. Negative for chills, fever and weight loss.  Eyes: Negative for blurred vision, double vision and photophobia.  Respiratory: Negative for shortness of breath.   Cardiovascular: Negative for chest pain.  Gastrointestinal: Negative for heartburn and nausea.  Skin: Negative for itching and rash.  Neurological: Negative for dizziness and headaches.  Endo/Heme/Allergies: Negative for environmental allergies. Does not bruise/bleed easily.  Psychiatric/Behavioral: Negative for hallucinations, substance abuse and suicidal ideas. The patient is not nervous/anxious.    Maternal Medical History:  Reason for admission: Nausea. IOL for preE  Contractions: Frequency: rare.   Perceived severity is mild.    Fetal activity: Perceived fetal activity is normal.   Last perceived fetal movement was within the past hour.    Prenatal complications: Pre-eclampsia.   Prenatal Complications - Diabetes: none.    Dilation: 3 Effacement (%): 50 Station: -3 Exam by:: [place], rn Blood pressure (!) 127/59, pulse 77, temperature 97.6 F (36.4 C), temperature source Oral, resp. rate 16, height 5\' 4"  (1.626 m), weight (!) 141.1 kg, last menstrual period 11/23/2018, unknown if currently breastfeeding. Maternal Exam:  Uterine Assessment: Contraction strength is mild.  Contraction frequency is rare.   Abdomen: Patient reports generalized tenderness.  Estimated fetal weight is AGA.   Fetal presentation: vertex  Introitus: Normal vulva. Vulva is negative for condylomata and lesion.  Normal vagina.  Vagina is negative for condylomata.  Pelvis: adequate for delivery.   Cervix: Cervix evaluated by digital exam.     Fetal Exam Fetal Monitor Review: Baseline rate: 125.  Variability: moderate (6-25 bpm).   Pattern: accelerations present and no decelerations.    Fetal State Assessment: Category I - tracings are normal.     Physical Exam  Constitutional: She is  oriented to person, place, and time. She appears well-developed and well-nourished.  Neck: Normal range of motion.  Cardiovascular: Intact distal pulses.  GI: There is generalized abdominal tenderness.  Genitourinary:    Vulva, vagina and uterus normal.     No vulval condylomata or lesion noted.   Musculoskeletal: Normal range of motion.  Neurological: She is alert and oriented to person, place, and time.  Skin: Skin is warm.  Psychiatric: She has a normal mood and affect. Her behavior is normal. Judgment and thought content normal.    Prenatal labs: ABO, Rh: --/--/O POS, O POS Performed at [facility], [address] [place], [place] [address]  [id number]/24 1434) Antibody: NEG (09/24 1434) Rubella: Immune (03/25 0000) RPR: Nonreactive (03/25 0000)  HBsAg: Negative (03/25 0000)  HIV: Non-reactive (03/25 0000)  GBS: Negative/-- (09/14 0000)   Assessment/Plan: Pt is a [id number] at 77 1/7wks presenting for iol due to preE - currently stable Admit GBS neg Sars covid neg Pitocin started ; will AROM now Pain control prn Anticipate svd   Raven Roberts 08/03/2019, 7:05 PM

## 2019-08-03 NOTE — Progress Notes (Signed)
Patient ID: Raven Roberts, female   DOB: 1996-01-29, 23 y.o.   MRN: [id number] AROM performed with clear fluid noted IUPC placed Pit per protocol Recheck prn CAT 1

## 2019-08-04 ENCOUNTER — Inpatient Hospital Stay (HOSPITAL_COMMUNITY): Payer: Medicaid Other | Admitting: Anesthesiology

## 2019-08-04 ENCOUNTER — Encounter (HOSPITAL_COMMUNITY): Payer: Self-pay

## 2019-08-04 LAB — CBC
HCT: 36.7 % (ref 36.0–46.0)
HCT: 34.9 % — ABNORMAL LOW (ref 36.0–46.0)
Hemoglobin: 12.6 g/dL (ref 12.0–15.0)
Hemoglobin: 11.8 g/dL — ABNORMAL LOW (ref 12.0–15.0)
MCH: 30.7 pg (ref 26.0–34.0)
MCH: 30.2 pg (ref 26.0–34.0)
MCHC: 34.3 g/dL (ref 30.0–36.0)
MCHC: 33.8 g/dL (ref 30.0–36.0)
MCV: 89.5 fL (ref 80.0–100.0)
MCV: 89.3 fL (ref 80.0–100.0)
Platelets: 225 10*3/uL (ref 150–400)
Platelets: 231 10*3/uL (ref 150–400)
RBC: 4.1 MIL/uL (ref 3.87–5.11)
RBC: 3.91 MIL/uL (ref 3.87–5.11)
RDW: 13.2 % (ref 11.5–15.5)
RDW: 13.3 % (ref 11.5–15.5)
WBC: 13.1 10*3/uL — ABNORMAL HIGH (ref 4.0–10.5)
WBC: 15.8 10*3/uL — ABNORMAL HIGH (ref 4.0–10.5)
nRBC: 0 % (ref 0.0–0.2)
nRBC: 0 % (ref 0.0–0.2)

## 2019-08-04 LAB — RPR: RPR Ser Ql: NONREACTIVE

## 2019-08-04 MED ORDER — OXYCODONE HCL 5 MG PO TABS: 10.0000 mg | ORAL_TABLET | ORAL | Status: DC | PRN

## 2019-08-04 MED ORDER — DIPHENHYDRAMINE HCL 25 MG PO CAPS: 25.0000 mg | ORAL_CAPSULE | Freq: Four times a day (QID) | ORAL | Status: DC | PRN

## 2019-08-04 MED ORDER — ONDANSETRON HCL 4 MG PO TABS: 4.0000 mg | ORAL_TABLET | ORAL | Status: DC | PRN

## 2019-08-04 MED ORDER — ZOLPIDEM TARTRATE 5 MG PO TABS: 5.0000 mg | ORAL_TABLET | Freq: Every evening | ORAL | Status: DC | PRN

## 2019-08-04 MED ORDER — OXYCODONE HCL 5 MG PO TABS
5.0000 mg | ORAL_TABLET | ORAL | Status: DC | PRN
  Administered 2019-08-04 (×2): 5 mg via ORAL
  Filled 2019-08-04 (×2): qty 1

## 2019-08-04 MED ORDER — PRENATAL MULTIVITAMIN CH
1.0000 | ORAL_TABLET | Freq: Every day | ORAL | Status: DC
  Administered 2019-08-05: 1 via ORAL
  Filled 2019-08-04 (×2): qty 1

## 2019-08-04 MED ORDER — ACETAMINOPHEN 325 MG PO TABS: 650.0000 mg | ORAL_TABLET | ORAL | Status: DC | PRN

## 2019-08-04 MED ORDER — TETANUS-DIPHTH-ACELL PERTUSSIS 5-2.5-18.5 LF-MCG/0.5 IM SUSP: 0.5000 mL | Freq: Once | INTRAMUSCULAR | Status: DC

## 2019-08-04 MED ORDER — WITCH HAZEL-GLYCERIN EX PADS: 1.0000 "application " | MEDICATED_PAD | CUTANEOUS | Status: DC | PRN

## 2019-08-04 MED ORDER — SIMETHICONE 80 MG PO CHEW: 80.0000 mg | CHEWABLE_TABLET | ORAL | Status: DC | PRN

## 2019-08-04 MED ORDER — BENZOCAINE-MENTHOL 20-0.5 % EX AERO
1.0000 "application " | INHALATION_SPRAY | CUTANEOUS | Status: DC | PRN
  Administered 2019-08-04: 1 via TOPICAL
  Filled 2019-08-04: qty 56

## 2019-08-04 MED ORDER — INFLUENZA VAC SPLIT QUAD 0.5 ML IM SUSY
0.5000 mL | PREFILLED_SYRINGE | INTRAMUSCULAR | Status: AC
  Administered 2019-08-05: 0.5 mL via INTRAMUSCULAR

## 2019-08-04 MED ORDER — LIDOCAINE HCL (PF) 1 % IJ SOLN
INTRAMUSCULAR | Status: DC | PRN
  Administered 2019-08-04 (×2): 4 mL via EPIDURAL

## 2019-08-04 MED ORDER — ONDANSETRON HCL 4 MG/2ML IJ SOLN: 4.0000 mg | INTRAMUSCULAR | Status: DC | PRN

## 2019-08-04 MED ORDER — COCONUT OIL OIL: 1.0000 "application " | TOPICAL_OIL | Status: DC | PRN

## 2019-08-04 MED ORDER — PNEUMOCOCCAL VAC POLYVALENT 25 MCG/0.5ML IJ INJ: 0.5000 mL | INJECTION | INTRAMUSCULAR | Status: DC

## 2019-08-04 MED ORDER — IBUPROFEN 600 MG PO TABS
600.0000 mg | ORAL_TABLET | Freq: Four times a day (QID) | ORAL | Status: DC
  Administered 2019-08-04 – 2019-08-05 (×6): 600 mg via ORAL
  Filled 2019-08-04 (×6): qty 1

## 2019-08-04 MED ORDER — SODIUM CHLORIDE (PF) 0.9 % IJ SOLN
INTRAMUSCULAR | Status: DC | PRN
  Administered 2019-08-04: 12 mL/h via EPIDURAL

## 2019-08-04 MED ORDER — SENNOSIDES-DOCUSATE SODIUM 8.6-50 MG PO TABS
2.0000 | ORAL_TABLET | ORAL | Status: DC
  Administered 2019-08-04: 2 via ORAL
  Filled 2019-08-04: qty 2

## 2019-08-04 MED ORDER — DIBUCAINE (PERIANAL) 1 % EX OINT: 1.0000 "application " | TOPICAL_OINTMENT | CUTANEOUS | Status: DC | PRN

## 2019-08-04 NOTE — Progress Notes (Signed)
Post Partum Day 0 Subjective: no complaints, up ad lib and tolerating PO  Objective: Blood pressure 120/70, pulse 75, temperature 98.1 F (36.7 C), temperature source Oral, resp. rate 18, height 5\' 4"  (1.626 m), weight (!) 141.1 kg, last menstrual period 11/23/2018, SpO2 100 %, unknown if currently breastfeeding.  Physical Exam:  General: alert and cooperative Lochia: appropriate Uterine Fundus: firm   Recent Labs    08/04/19 0150 08/04/19 0606  HGB 12.6 11.8*  HCT 36.7 34.9*    Assessment/Plan: BP stable thus far on no meds with IOL for preeclampsia, will follow today Plans circumcision in office and pp interval tubal with Dr. Terri Piedra    LOS: 1 day   Logan Bores 08/04/2019, 9:24 AM

## 2019-08-04 NOTE — Anesthesia Postprocedure Evaluation (Signed)
Anesthesia Post Note  Patient: Raven Roberts  Procedure(s) Performed: AN AD HOC LABOR EPIDURAL     Patient location during evaluation: Mother Baby Anesthesia Type: Epidural Level of consciousness: awake and alert Pain management: pain level controlled Vital Signs Assessment: post-procedure vital signs reviewed and stable Respiratory status: spontaneous breathing, nonlabored ventilation and respiratory function stable Cardiovascular status: stable Postop Assessment: no headache, no backache and epidural receding Anesthetic complications: no    Last Vitals:  Vitals:   08/04/19 0300 08/04/19 0400  BP: 137/71 139/65  Pulse: 78 71  Resp: 18 18  Temp: 36.8 C 37 C  SpO2: 97% 98%    Last Pain:  Vitals:   08/04/19 0747  TempSrc:   PainSc: 7    Pain Goal:                   Rayvon Char

## 2019-08-04 NOTE — Anesthesia Preprocedure Evaluation (Signed)
Anesthesia Evaluation  Patient identified by MRN, date of birth, ID band Patient awake    Reviewed: Allergy & Precautions, NPO status , Patient's Chart, lab work & pertinent test results  Airway Mallampati: III  TM Distance: >3 FB     Dental  (+) Dental Advisory Given, Teeth Intact   Pulmonary asthma , Current Smoker, former smoker,    Pulmonary exam normal        Cardiovascular Exercise Tolerance: Good hypertension,  Rhythm:Regular Rate:Normal     Neuro/Psych  Headaches, negative psych ROS   GI/Hepatic negative GI ROS, Neg liver ROS,   Endo/Other  Morbid obesity  Renal/GU      Musculoskeletal   Abdominal (+) + obese,   Peds  Hematology   Anesthesia Other Findings   Reproductive/Obstetrics (+) Pregnancy                             Lab Results  Component Value Date   WBC 10.9 (H) 08/03/2019   HGB 12.3 08/03/2019   HCT 35.4 (L) 08/03/2019   MCV 89.8 08/03/2019   PLT 228 08/03/2019   Lab Results  Component Value Date   CREATININE 0.64 08/03/2019   BUN 8 08/03/2019   NA 135 08/03/2019   K 3.5 08/03/2019   CL 105 08/03/2019   CO2 20 (L) 08/03/2019     Anesthesia Physical  Anesthesia Plan  ASA: III  Anesthesia Plan: Epidural   Post-op Pain Management:    Induction: Intravenous  PONV Risk Score and Plan: 3  Airway Management Planned:   Additional Equipment:   Intra-op Plan:   Post-operative Plan:   Informed Consent: I have reviewed the patients History and Physical, chart, labs and discussed the procedure including the risks, benefits and alternatives for the proposed anesthesia with the patient or authorized representative who has indicated his/her understanding and acceptance.       Plan Discussed with:   Anesthesia Plan Comments:         Anesthesia Quick Evaluation

## 2019-08-04 NOTE — Anesthesia Procedure Notes (Signed)
Epidural Patient location during procedure: OB  Staffing Anesthesiologist: Nolon Nations, MD Performed: anesthesiologist   Preanesthetic Checklist Completed: patient identified, pre-op evaluation, timeout performed, IV checked, risks and benefits discussed and monitors and equipment checked  Epidural Patient position: sitting Prep: site prepped and draped and DuraPrep Patient monitoring: heart rate, continuous pulse ox and blood pressure Approach: midline Location: L2-L3 Injection technique: LOR air and LOR saline  Needle:  Needle type: Tuohy  Needle gauge: 17 G Needle length: 9 cm Needle insertion depth: 9 cm Catheter type: closed end flexible Catheter size: 19 Gauge Catheter at skin depth: 14 cm Test dose: negative  Assessment Sensory level: T8 Events: blood not aspirated, injection not painful, no injection resistance, negative IV test and no paresthesia  Additional Notes Reason for block:procedure for pain

## 2019-08-04 NOTE — Lactation Note (Signed)
This note was copied from a baby's chart. Lactation Consultation Note  Patient Name: Raven Roberts [id number] Date: 08/04/2019 Reason for consult: Initial assessment;Early term 37-38.6wks;Primapara;1st time breastfeeding  P4 mother whose infant is now 51 hours old.  Mother breast fed her first child for 3 months, her second child for 1 month and her third child for 1 week.  She really wants to breast feed this baby longer.  Mother was holding baby STS when I arrived.  She had breast fed 2 hours ago.  Encouraged STS and feeding 8-12 times/24 hours or sooner if baby shows feeding cues.  Mother's breasts are large, soft and non tender and nipples are everted and intact.  Had mother demonstrate hand expression.  Provided helpful suggestions  on hand/finger placement and her technique and she was able to express a couple drops of colostrum.  Mother will feed back any EBM she obtains to baby.  She may be interested in initiating the DEBP.  Suggested mother call for latch assistance even though she feels like baby has been latching well.  She did say that he "bit" her one time on her nipple and I reminded her to try to obtain a wide mouth prior to latching.  I stressed the importance of asking for help to latch as needed.  Mother verbalized understanding.  Since he is an ETI he will need to be monitored closely.  Mom made aware of O/P services, breastfeeding support groups, community resources, and our phone # for post-discharge questions. Mother has a DEBP for home use, however, I am not certain it is a high quality pump.  She is also a [facility] participant [facility] and is aware that she is able to obtain a WIC pump.  She has already spoken to a [facility] rep and was told to call back on Monday.  Informed mother of our [facility] loaner program here because she will, most likely, be discharged over the weekend.  Support person present.         Maternal Data Formula Feeding for Exclusion: No Has patient  been taught Hand Expression?: Yes Does the patient have breastfeeding experience prior to this delivery?: Yes  Feeding Feeding Type: Breast Fed  LATCH Score                   Interventions    Lactation Tools Discussed/Used WIC Program: Yes   Consult Status Consult Status: Follow-up Date: 08/05/19 Follow-up type: In-patient    Shamere Campas R Asami Lambright 08/04/2019, 6:10 PM

## 2019-08-04 NOTE — Progress Notes (Signed)
Patient ID: Raven Roberts, female   DOB: Aug 05, 1996, 22 y.o.   MRN: [id number] Pt with increasing pain with contractions and pelvic pressure.  VS - 145/89 EFM - 120, cat 1 TOCO - contractions q 1-35mins SVE - 8/80/-1  A/P: [id number] female progressing in labor         Pt would like an epidual.         Anesthesiologist notified          Anticipate imminent delivery

## 2019-08-04 NOTE — Discharge Summary (Signed)
OB Discharge Summary     Patient Name: Raven Roberts DOB: 1996-01-23 MRN: [id number]  Date of admission: 08/03/2019 Delivering MD: Carlynn Purl [facility]   Date of discharge: 08/05/2019  Admitting diagnosis: PREG Intrauterine pregnancy: [redacted]w[redacted]d     Secondary diagnosis:  Active Problems:   Preeclampsia, third trimester   SVD (spontaneous vaginal delivery)  Additional problems: none     Discharge diagnosis: Term Pregnancy Delivered and Preeclampsia (mild)                                                                                                Post partum procedures:none  Augmentation: AROM and Pitocin  Complications: None  Hospital course:  Induction of Labor With Vaginal Delivery   23 y.o. yo [id number] at [redacted]w[redacted]d was admitted to the hospital 08/03/2019 for induction of labor.  Indication for induction: Preeclampsia.  Patient had an uncomplicated labor course as follows: Membrane Rupture Time/Date: 7:31 PM ,08/03/2019   Intrapartum Procedures: Episiotomy: None [1]                                         Lacerations:  None [1]  Patient had delivery of a Viable infant.  Information for the patient's newborn:  Cassidey, [place] [id number]  Delivery Method: Vaginal, Spontaneous(Filed from Delivery Summary)    08/04/2019  Details of delivery can be found in separate delivery note.  Patient had a routine postpartum course and blood pressures remained normal at 127-139/65-78.  Labs were normal throughout her stay.  She plans an interval tubal with Dr. Terri Piedra postpartum. Patient is discharged home 08/05/19.  Physical exam  Vitals:   08/04/19 1202 08/04/19 1620 08/04/19 2058 08/05/19 0612  BP: 130/78 127/75 130/73 115/82  Pulse: 64 71 80 68  Resp: 18 16 18 17   Temp: 97.9 F (36.6 C) 98.3 F (36.8 C) 98.1 F (36.7 C) 98 F (36.7 C)  TempSrc:   Other (Comment) Oral  SpO2: 100% 100% 100% 100%  Weight:      Height:       General: alert and cooperative Lochia:  appropriate Uterine Fundus: firm  Labs: Lab Results  Component Value Date   WBC 15.8 (H) 08/04/2019   HGB 11.8 (L) 08/04/2019   HCT 34.9 (L) 08/04/2019   MCV 89.3 08/04/2019   PLT 231 08/04/2019   CMP Latest Ref Rng & Units 08/03/2019  Glucose 70 - 99 mg/dL 110(H)  BUN 6 - 20 mg/dL 8  Creatinine 0.44 - 1.00 mg/dL 0.64  Sodium 135 - 145 mmol/L 135  Potassium 3.5 - 5.1 mmol/L 3.5  Chloride 98 - 111 mmol/L 105  CO2 22 - 32 mmol/L 20(L)  Calcium 8.9 - 10.3 mg/dL 9.0  Total Protein 6.5 - 8.1 g/dL 6.2(L)  Total Bilirubin 0.3 - 1.2 mg/dL 0.2(L)  Alkaline Phos 38 - 126 U/L 112  AST 15 - 41 U/L 12(L)  ALT 0 - 44 U/L 9    Discharge instruction: per After Visit Summary and "Baby and Me  Booklet".  After visit meds:  Allergies as of 08/05/2019      Reactions   Pineapple Anaphylaxis, Shortness Of Breath, Swelling   Amoxicillin Rash, Other (See Comments)   Has patient had a PCN reaction causing immediate rash, facial/tongue/throat swelling, SOB or lightheadedness with hypotension: NO . Patient states rash as a child Has patient had a PCN reaction causing severe rash involving mucus membranes or skin necrosis: No Has patient had a PCN reaction that required hospitalization No Has patient had a PCN reaction occurring within the last 10 years: No If all of the above answers are "NO", then may proceed with Cephalosporin use.   Penicillins Rash, Other (See Comments)   Has patient had a PCN reaction causing immediate rash, facial/tongue/throat swelling, SOB or lightheadedness with hypotension: NO.  Patient states rash as a child Has patient had a PCN reaction causing severe rash involving mucus membranes or skin necrosis: No Has patient had a PCN reaction that required hospitalization No Has patient had a PCN reaction occurring within the last 10 years: No If all of the above answers are "NO", then may proceed with Cephalosporin use.      Medication List    STOP taking these medications    labetalol 200 MG tablet Commonly known as: NORMODYNE     TAKE these medications   acetaminophen 325 MG tablet Commonly known as: Tylenol Take 2 tablets (650 mg total) by mouth every 4 (four) hours as needed (for pain scale < 4). What changed:   medication strength  how much to take  when to take this  reasons to take this   ibuprofen 600 MG tablet Commonly known as: ADVIL Take 1 tablet (600 mg total) by mouth every 6 (six) hours.   oxyCODONE 5 MG immediate release tablet Commonly known as: Oxy IR/ROXICODONE Take 1 tablet (5 mg total) by mouth every 4 (four) hours as needed (pain scale 4-7).   PRENATAL ADULT GUMMY/DHA/FA PO Take 1 tablet by mouth daily.   WOMENS MULTIVITAMIN PO Take 1 tablet by mouth daily.       Diet: home with mother  Activity: Advance as tolerated. Pelvic rest for 6 weeks.   Outpatient follow up:one week for circumcision and blood pressure check Follow up Appt:No future appointments. Follow up Visit:No follow-ups on file.  Postpartum contraception: Tubal Ligation  Newborn Data: Live born female  Birth Weight: 6 lb 5.1 oz (2865 g) APGAR: 40, 9  Newborn Delivery   Birth date/time: 08/04/2019 00:53:00 Delivery type: Vaginal, Spontaneous      Baby Feeding: Breast Disposition:home with mother   08/05/2019 Logan Bores, MD

## 2019-08-05 ENCOUNTER — Encounter (HOSPITAL_COMMUNITY): Payer: Self-pay | Admitting: *Deleted

## 2019-08-05 ENCOUNTER — Other Ambulatory Visit: Payer: Self-pay

## 2019-08-05 MED ORDER — IBUPROFEN 600 MG PO TABS: 600.0000 mg | ORAL_TABLET | Freq: Four times a day (QID) | ORAL | 0 refills | Status: DC

## 2019-08-05 MED ORDER — OXYCODONE HCL 5 MG PO TABS: 5.0000 mg | ORAL_TABLET | ORAL | 0 refills | Status: DC | PRN

## 2019-08-05 MED ORDER — ACETAMINOPHEN 325 MG PO TABS: 650.0000 mg | ORAL_TABLET | ORAL | 1 refills | Status: DC | PRN

## 2019-08-05 NOTE — Progress Notes (Signed)
Post Partum Day 2 Subjective: no complaints, up ad lib and tolerating PO Cramping severe enough to need occasional oxycodone, bleeding WNL  Objective: Blood pressure 115/82, pulse 68, temperature 98 F (36.7 C), temperature source Oral, resp. rate 17, height 5\' 4"  (1.626 m), weight (!) 141.1 kg, last menstrual period 11/23/2018, SpO2 100 %, unknown if currently breastfeeding.  Physical Exam:  General: alert and cooperative Lochia: appropriate Uterine Fundus: firm   Recent Labs    08/04/19 0150 08/04/19 0606  HGB 12.6 11.8*  HCT 36.7 34.9*    Assessment/Plan: Discharge home  Ibuprofen and a few oxycodone for cramping Circ in office in next week with BP check Planning interval tubal   LOS: 2 days   Logan Bores 08/05/2019, 8:12 AM

## 2019-08-23 NOTE — H&P (Signed)
Raven Roberts is an 23 y.o.[id number] female presenting for scheduled laparoscopic bilateral tubal ligation. Pt reports completed family - requests permanent sterilization. She has been counseled and is aware of reversible long term options and also risk of regret  Pertinent Gynecological History: Menses: flow is moderate Contraception: none - 4 weeks postpartum DES exposure: unknown Blood transfusions: none Sexually transmitted diseases History of chlamydia and trichomonas Previous GYN Procedures: none  Last mammogram: n/a Date: Last pap: normal Date:01/2019 OB History: G5, P4014  Menstrual History: Menarche age: 66 No LMP recorded.    Past Medical History:  Diagnosis Date  . Admission for tubal ligation 07/25/2018  . Asthma   . Infection    uti, trich  . Migraines   . No pertinent past medical history   . Rectal pain, chronic     Past Surgical History:  Procedure Laterality Date  . DILATION AND EVACUATION N/A 04/18/2018   Procedure: DILATATION AND EVACUATION WITH ULTRASOUND GUIDANCE;  Surgeon: Janyth Contes, MD;  Location: [facility];  Service: Gynecology;  Laterality: N/A;  . NO PAST SURGERIES    . TOOTH EXTRACTION      Family History  Problem Relation Age of Onset  . Hypertension Mother   . Hypertension Father   . Diabetes Maternal Aunt   . Hypertension Maternal Aunt   . Diabetes Maternal Uncle   . Cancer Maternal Grandmother   . Hypertension Maternal Grandmother   . Cancer Maternal Grandfather        colon  . Hypertension Maternal Grandfather   . Stroke Maternal Grandfather     Social History:  reports that she has been smoking. She has never used smokeless tobacco. She reports current alcohol use. She reports previous drug use. Drug: Marijuana.  Allergies:  Allergies  Allergen Reactions  . Pineapple Anaphylaxis, Shortness Of Breath and Swelling  . Amoxicillin Rash and Other (See Comments)    Has patient had a PCN reaction causing immediate rash,  facial/tongue/throat swelling, SOB or lightheadedness with hypotension: NO . Patient states rash as a child Has patient had a PCN reaction causing severe rash involving mucus membranes or skin necrosis: No Has patient had a PCN reaction that required hospitalization No Has patient had a PCN reaction occurring within the last 10 years: No If all of the above answers are "NO", then may proceed with Cephalosporin use.  Marland Kitchen Penicillins Rash and Other (See Comments)    Has patient had a PCN reaction causing immediate rash, facial/tongue/throat swelling, SOB or lightheadedness with hypotension: NO.  Patient states rash as a child Has patient had a PCN reaction causing severe rash involving mucus membranes or skin necrosis: No Has patient had a PCN reaction that required hospitalization No Has patient had a PCN reaction occurring within the last 10 years: No If all of the above answers are "NO", then may proceed with Cephalosporin use.    No medications prior to admission.    Review of Systems  Constitutional: Negative for chills, fever, malaise/fatigue and weight loss.  Eyes: Negative for blurred vision.  Respiratory: Negative for shortness of breath.   Cardiovascular: Negative for chest pain.  Gastrointestinal: Negative for abdominal pain, heartburn, nausea and vomiting.  Genitourinary: Negative for dysuria.  Musculoskeletal: Negative for myalgias.  Skin: Negative for itching and rash.  Neurological: Negative for dizziness and headaches.  Endo/Heme/Allergies: Does not bruise/bleed easily.  Psychiatric/Behavioral: Negative for depression, hallucinations, substance abuse and suicidal ideas. The patient is nervous/anxious.     unknown if currently breastfeeding.  Physical Exam  No results found for this or any previous visit (from the past 24 hour(s)).  No results found.  Assessment/Plan: [id number] with complete family here for permanent sterilization via laparoscopic bilateral tubal  ligation Procedure reviewed; consent confirmed All questions answered NPO To OR when ready  Venetia Night  08/23/2019, 12:54 PM

## 2019-08-24 ENCOUNTER — Inpatient Hospital Stay (HOSPITAL_COMMUNITY): Admission: RE | Admit: 2019-08-24 | Payer: Medicaid Other | Source: Ambulatory Visit

## 2019-08-24 ENCOUNTER — Encounter (HOSPITAL_BASED_OUTPATIENT_CLINIC_OR_DEPARTMENT_OTHER): Payer: Self-pay | Admitting: *Deleted

## 2019-08-25 ENCOUNTER — Other Ambulatory Visit (HOSPITAL_COMMUNITY)
Admission: RE | Admit: 2019-08-25 | Discharge: 2019-08-25 | Disposition: A | Discharge status: Home / Self Care | Payer: Medicaid Other | Source: Ambulatory Visit | Attending: Obstetrics and Gynecology | Admitting: Obstetrics and Gynecology

## 2019-08-25 DIAGNOSIS — Z01812 Encounter for preprocedural laboratory examination: Secondary | ICD-10-CM | POA: Insufficient documentation

## 2019-08-25 DIAGNOSIS — Z20828 Contact with and (suspected) exposure to other viral communicable diseases: Secondary | ICD-10-CM | POA: Diagnosis not present

## 2019-08-25 NOTE — Progress Notes (Addendum)
ADDENDUM:  After several attempt to get in touch with patient today because she had not been by office to get weight done yet. Received call from dr Terri Piedra office, Janett Billow (office staff), stated pt did come in this afternoon to get weighed.  Actual weight 300.8lb, that made her BMI 51.63. Chart reviewed with anesthesia, Konrad Felix PA, whom also reviewed chart with dr Candida Peeling mda,  Dr Sabra Heck stated move patient to main OR due to BMI 51.63.  Called and spoke w/ Jarrett Soho, or schedule, let her know about decision.  Spoke w/ Jarrett Soho, or scheduler for dr Terri Piedra, via phone inquired about pt weight since she has given birth.  Jarrett Soho stated pt was not weighed at pre-op appointment.  Since dr Terri Piedra office and I need to know pt weight to determine if pt a candidate for [facility], I called and spoke w/ pt this morning she is going to dr Terri Piedra office to get weighed before her covd test appt at 1105 today and Jarrett Soho will call me back.

## 2019-08-27 LAB — NOVEL CORONAVIRUS, NAA (HOSP ORDER, SEND-OUT TO REF LAB; TAT 18-24 HRS): SARS-CoV-2, NAA: NOT DETECTED

## 2019-08-28 ENCOUNTER — Encounter (HOSPITAL_BASED_OUTPATIENT_CLINIC_OR_DEPARTMENT_OTHER): Admission: RE | Payer: Self-pay | Source: Home / Self Care

## 2019-08-28 ENCOUNTER — Ambulatory Visit (HOSPITAL_BASED_OUTPATIENT_CLINIC_OR_DEPARTMENT_OTHER)
Admission: RE | Admit: 2019-08-28 | Payer: Medicaid Other | Source: Home / Self Care | Admitting: Obstetrics and Gynecology

## 2019-08-28 SURGERY — SALPINGECTOMY, BILATERAL, LAPAROSCOPIC
Anesthesia: General | Laterality: Bilateral

## 2019-09-01 IMAGING — US US OB COMP LESS 14 WK
1 series · 15 of 19 positions shown · non-contrast
Comparison: None.

CLINICAL DATA: Low back pain. Pregnant patient. Patient is 16 weeks
and 3 days pregnant based on her last menstrual period.

EXAM:
OBSTETRIC <14 WK ULTRASOUND
TECHNIQUE: Transabdominal ultrasound was performed for evaluation of the
gestation as well as the maternal uterus and adnexal regions.

[Series 1: us ob comp less 14 wk · 15 of 19 slices shown]
[im 1/19]
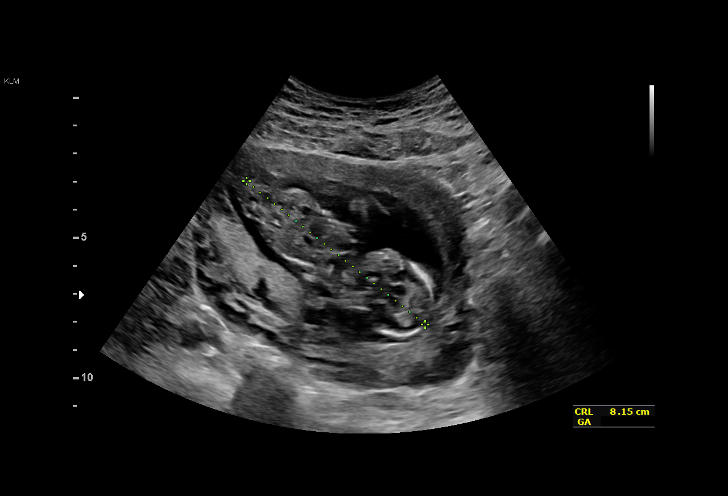
[im 2/19]
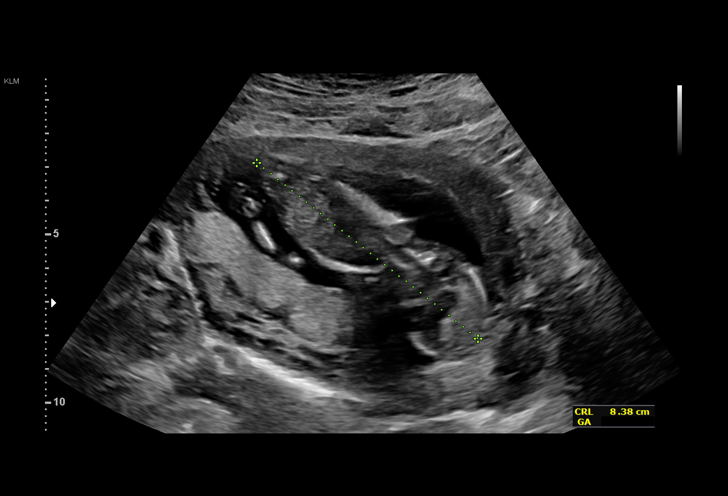
[im 4/19]
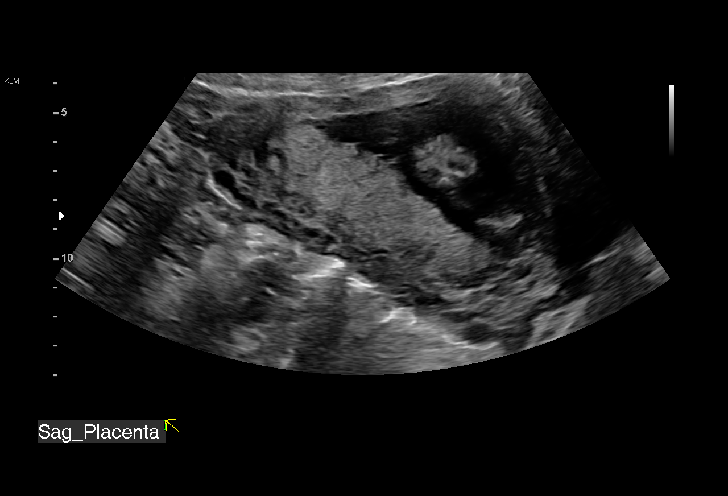
[im 5/19]
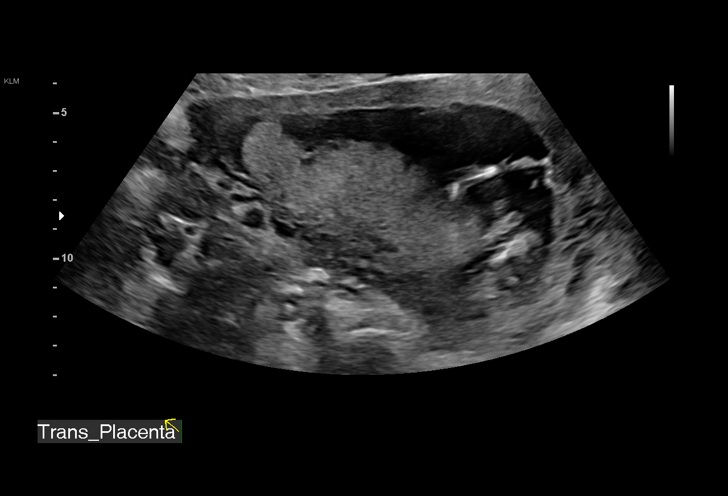
[im 6/19]
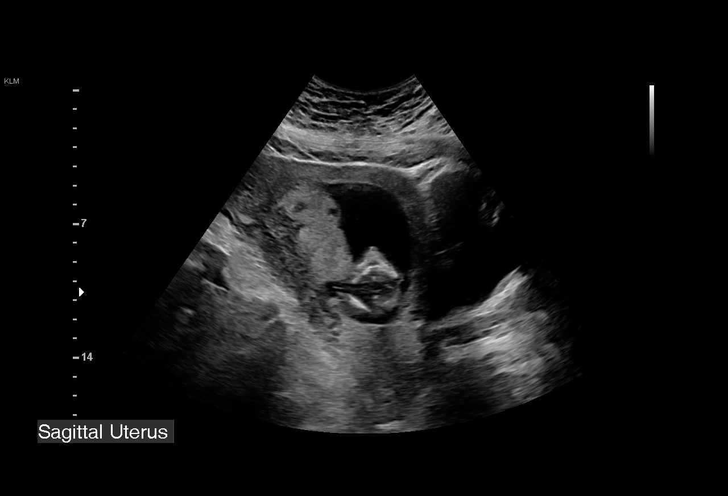
[im 7/19]
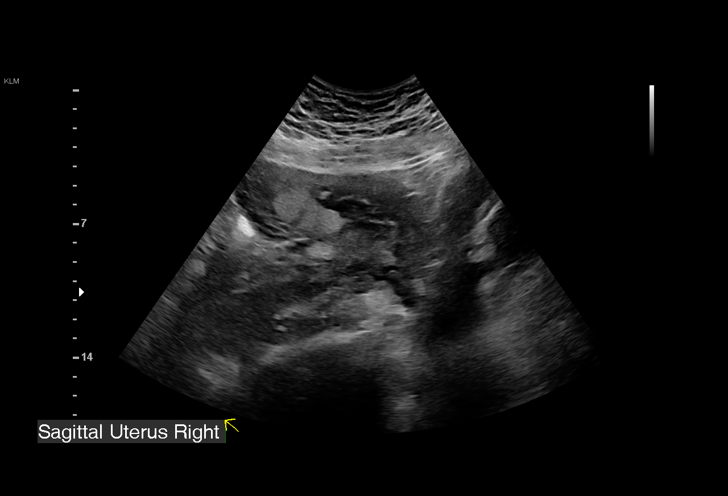
[im 9/19]
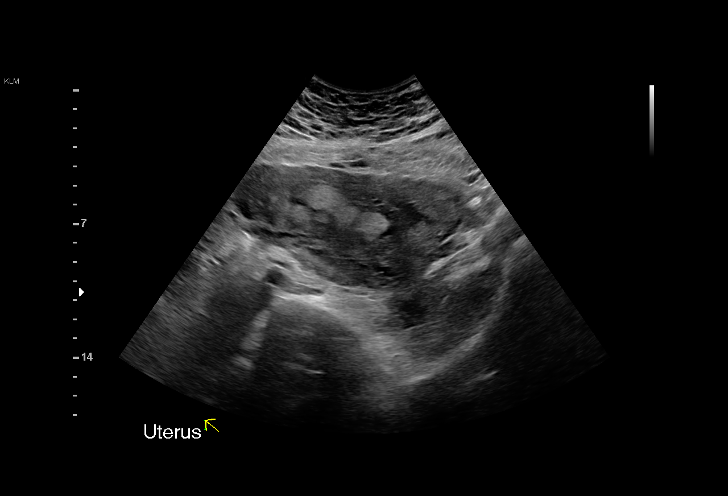
[im 10/19]
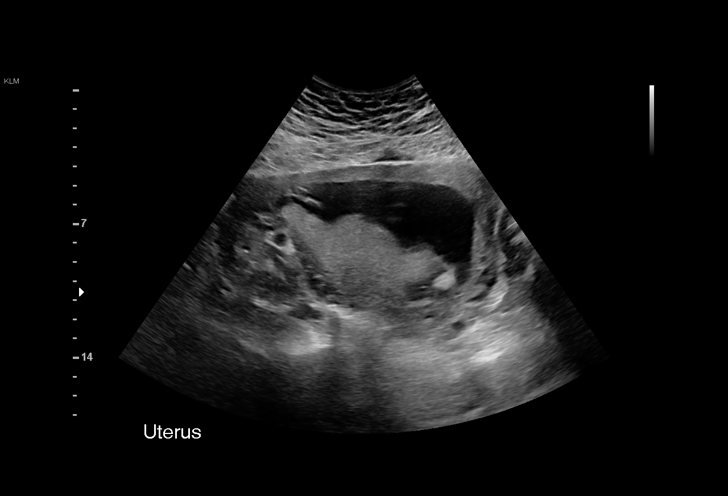
[im 11/19]
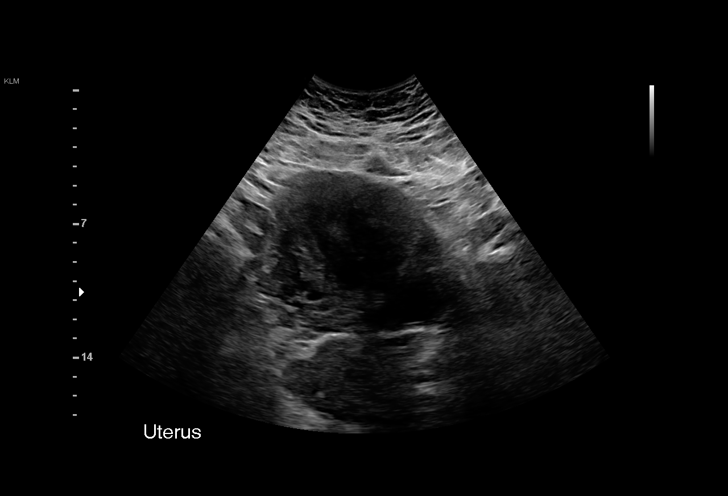
[im 13/19]
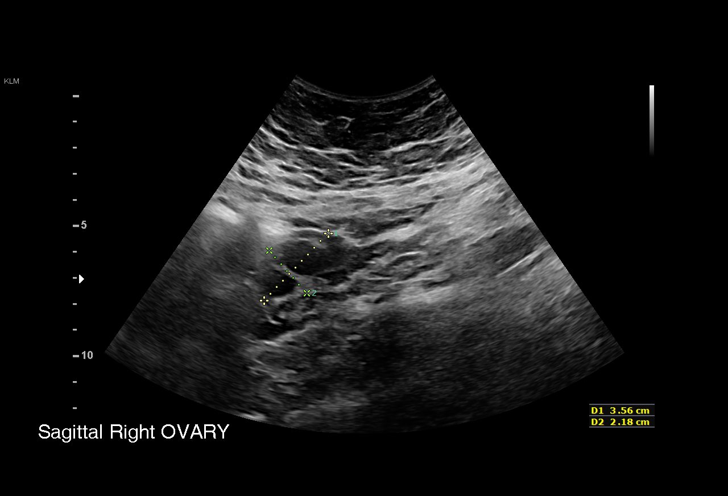
[im 14/19]
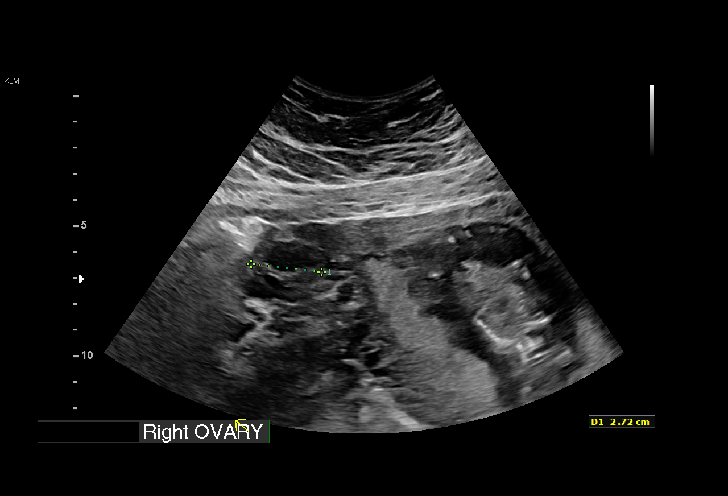
[im 15/19]
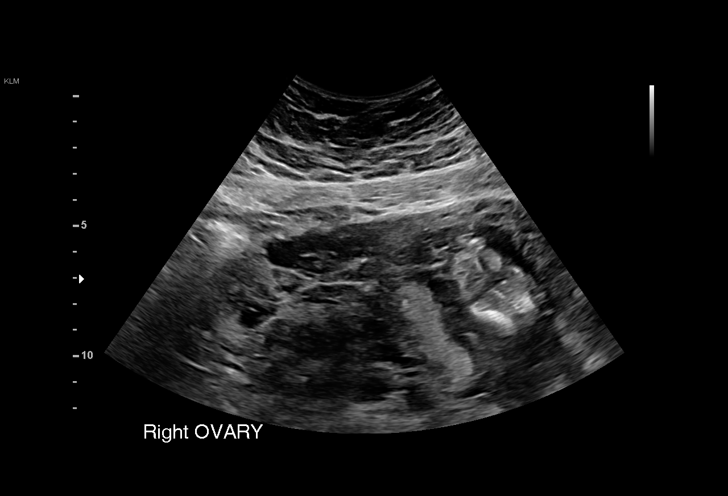
[im 16/19]
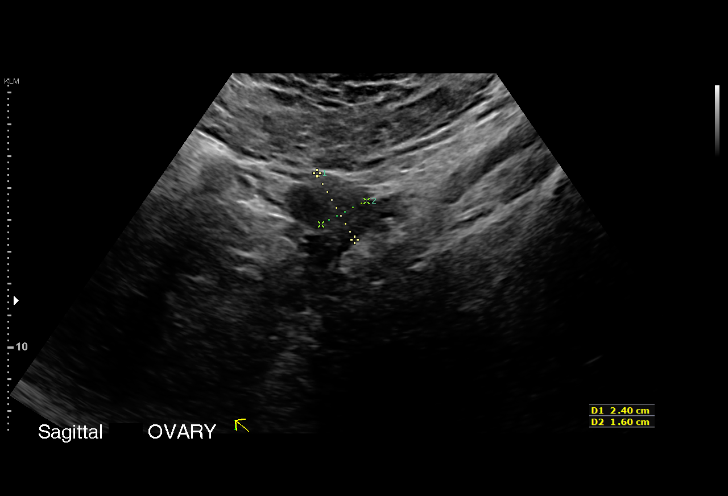
[im 18/19]
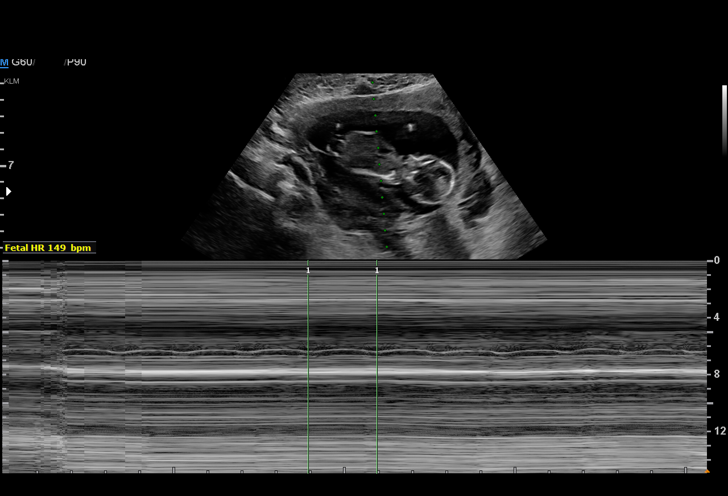
[im 19/19]
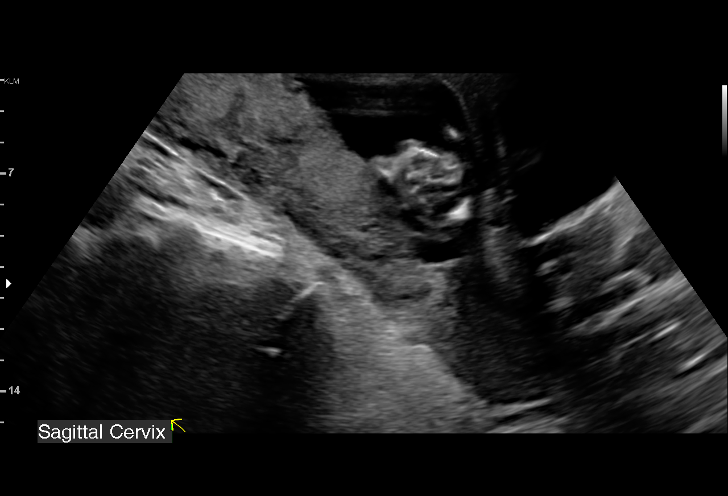

[15 of 19 positions shown; findings below may reference images not displayed]

FINDINGS: Intrauterine gestational sac: Single

Yolk sac:  Not Visualized.

Embryo:  Visualized.

Cardiac Activity: Visualized.

Heart Rate: 165 bpm

CRL:   82.6 mm   14 w 1 d                  US EDC: 10/05/2018

Subchorionic hemorrhage:  None visualized.

Maternal uterus/adnexae: No uterine masses or abnormalities. Normal
ovaries and adnexa. No pelvic free fluid.
IMPRESSION: 1. Single live anterior pregnancy with a measured gestational age of
14 weeks and 1 day.
2. No evidence of a pregnancy complication. No emergent maternal
abnormality.

## 2019-09-01 IMAGING — US US RENAL
1 series · 15 of 25 positions shown · non-contrast
Comparison: None.

CLINICAL DATA: Abdominal pain during pregnancy.

EXAM:
RENAL / URINARY TRACT ULTRASOUND COMPLETE

[Series 1: us renal · 15 of 26 slices shown]
[im 1/26]
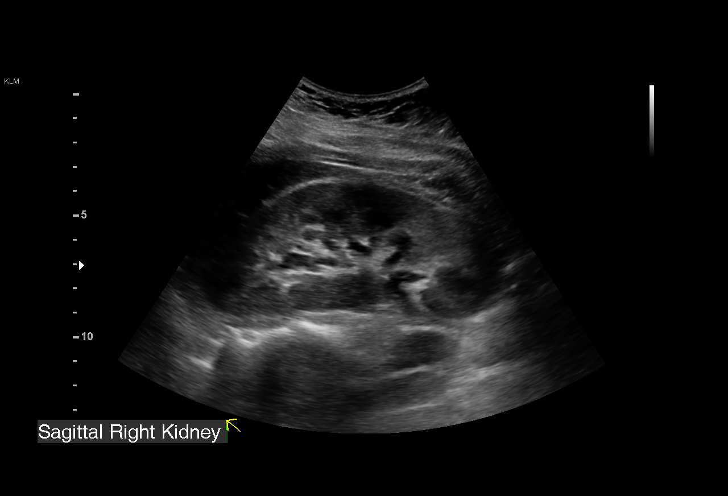
[im 3/26]
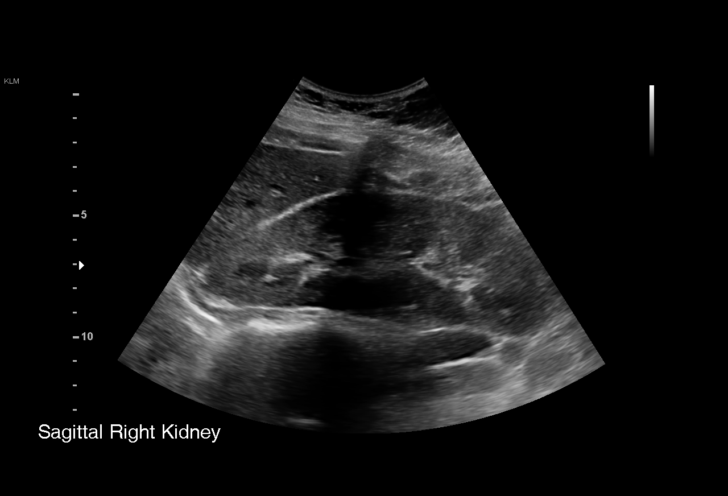
[im 5/26]
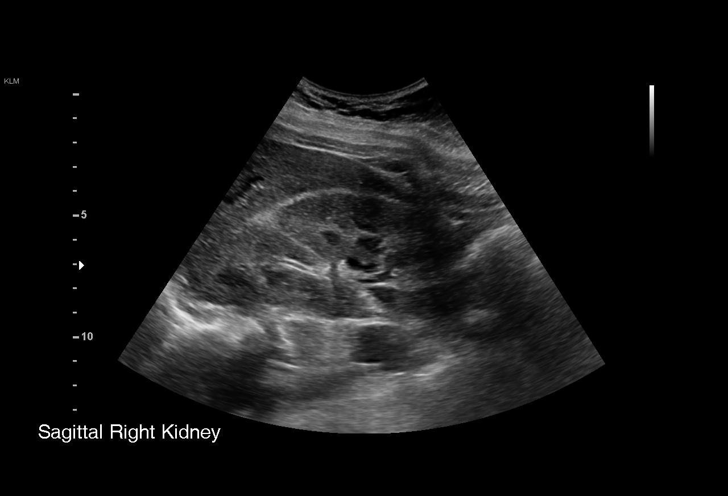
[im 6/26]
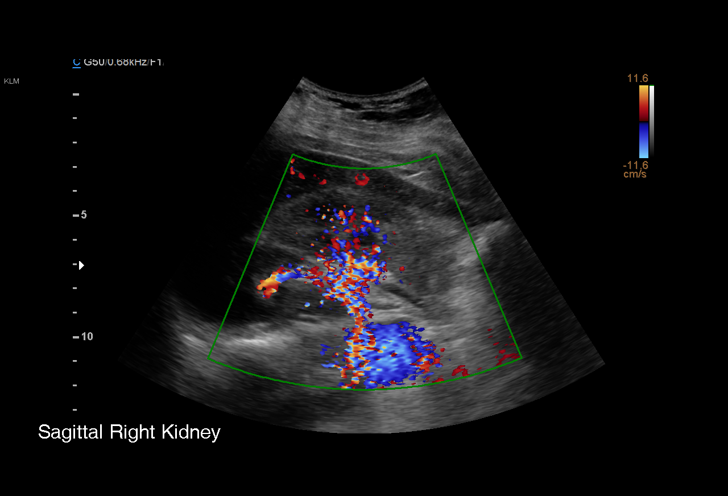
[im 8/26]
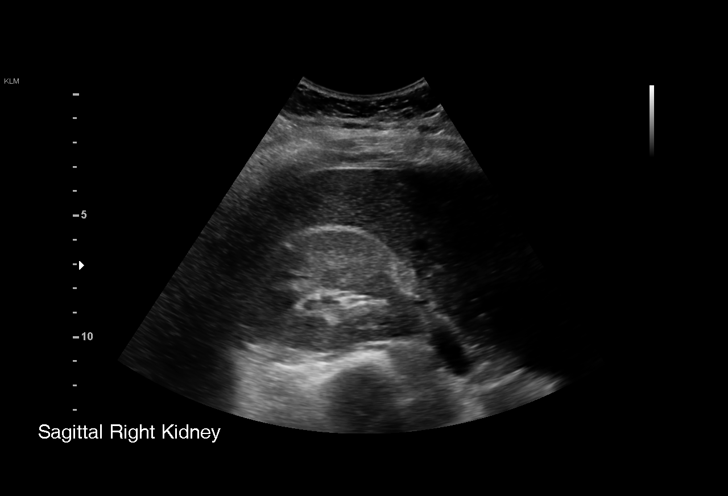
[im 10/26]
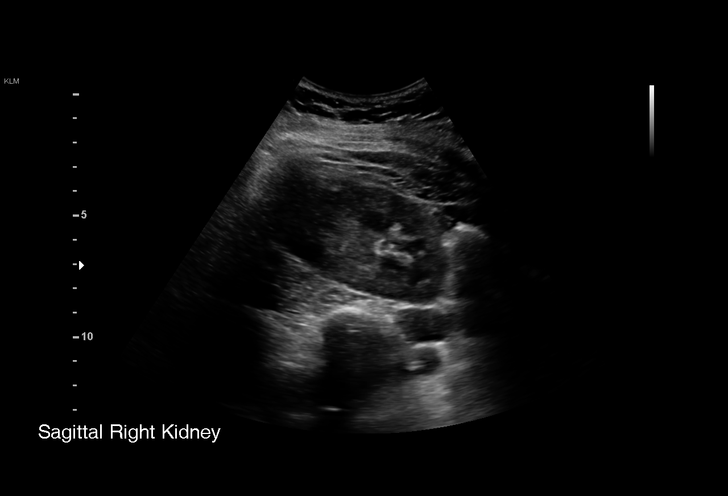
[im 11/26]
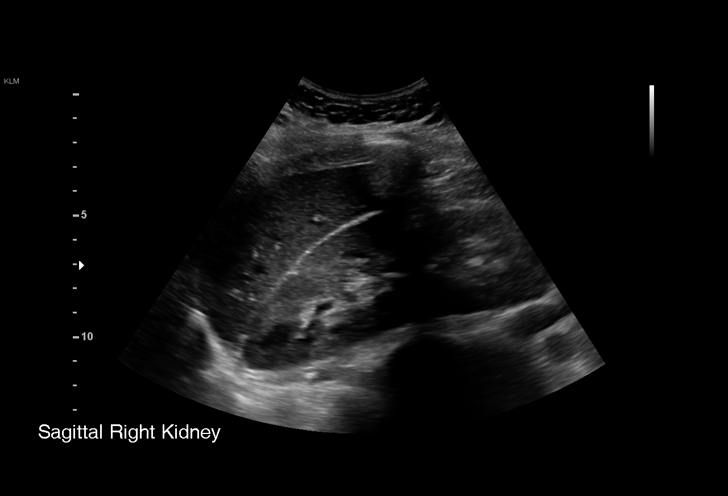
[im 13/26]
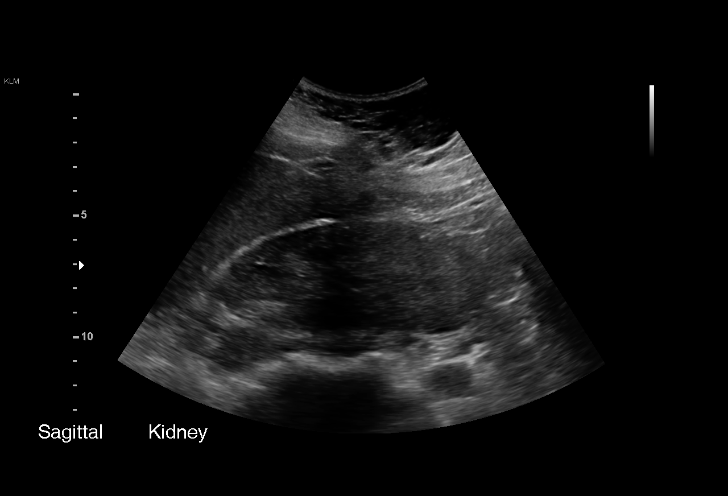
[im 15/26]
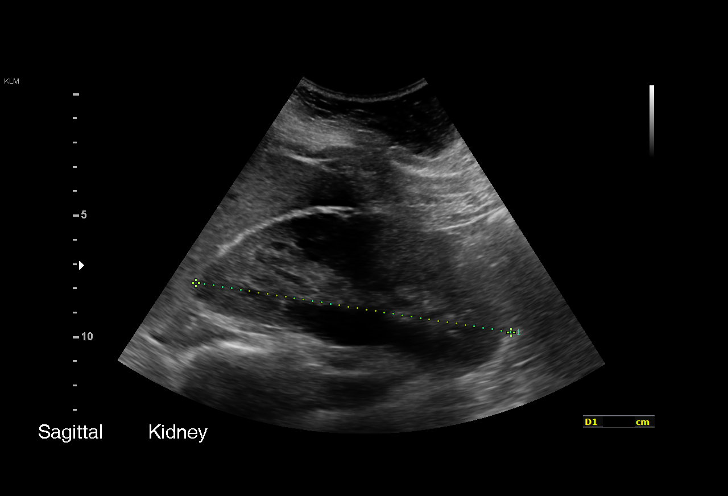
[im 16/26]
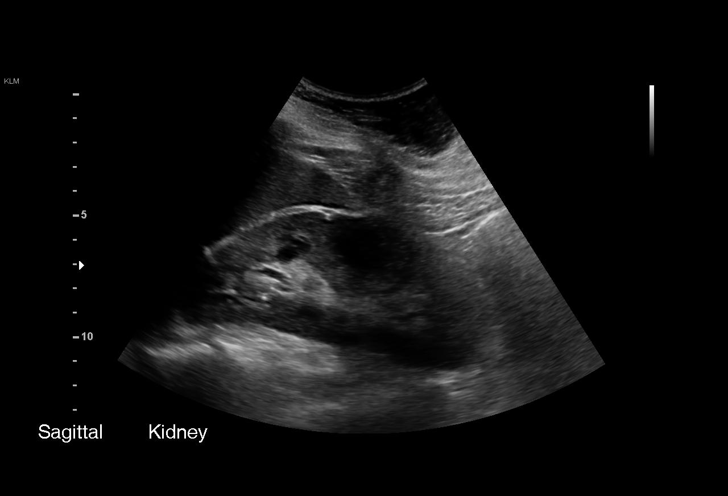
[im 18/26]
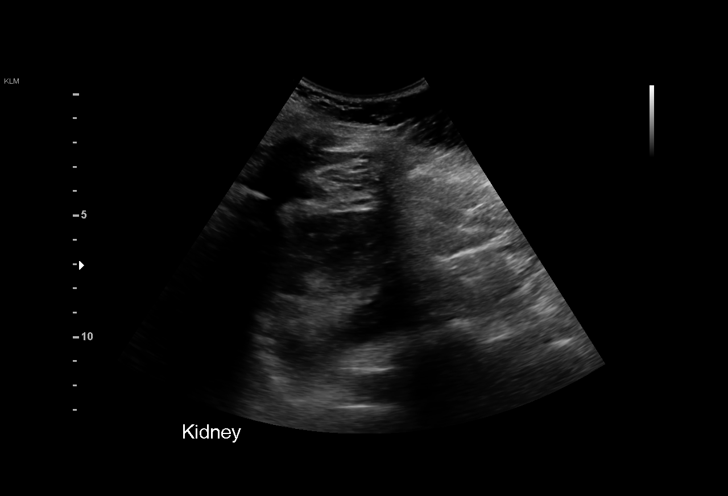
[im 20/26]
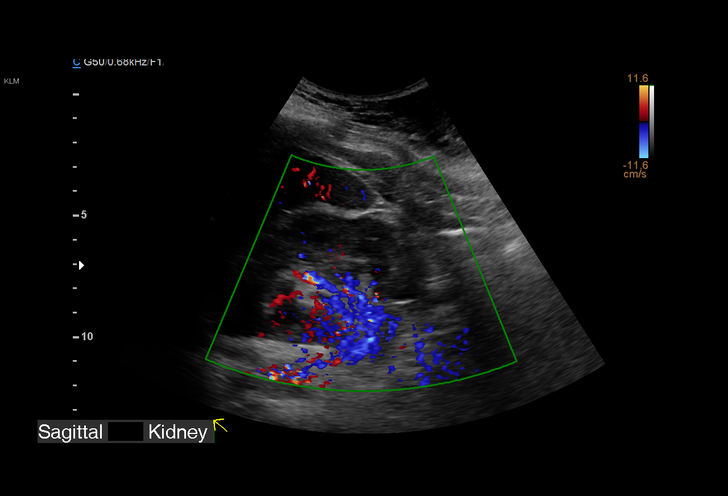
[im 21/26]
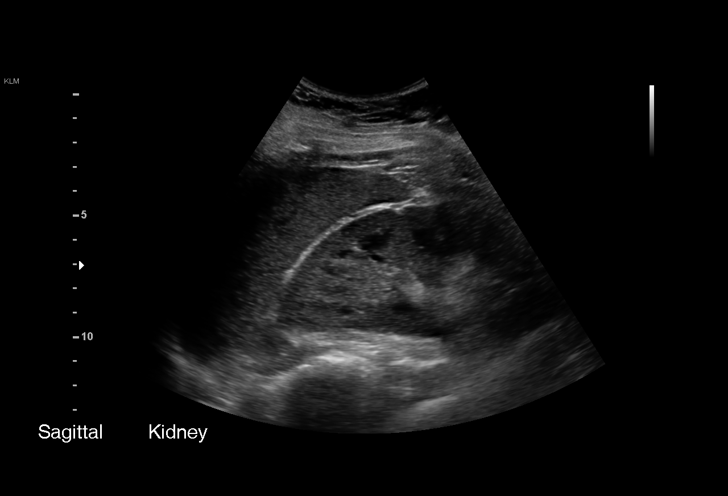
[im 23/26]
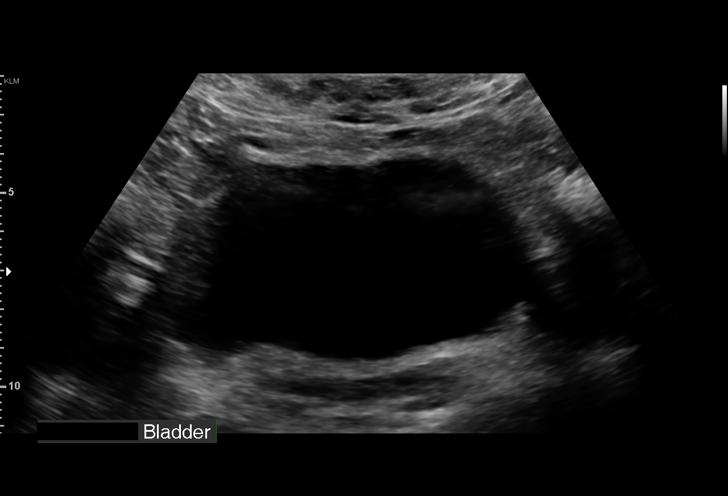
[im 26/26]
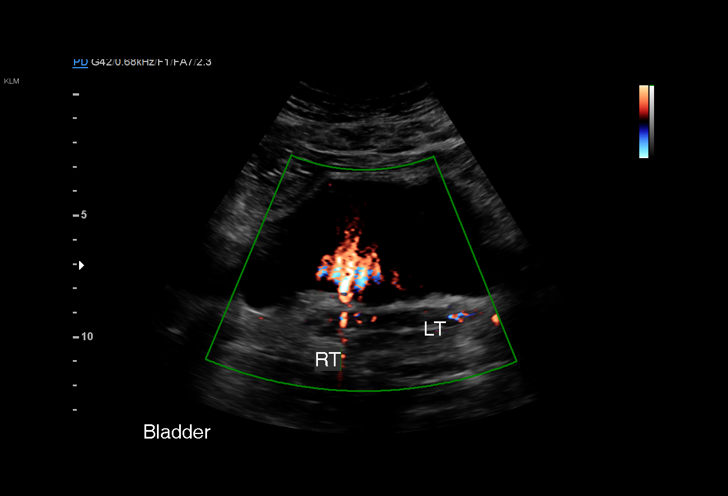

[15 of 25 positions shown; findings below may reference images not displayed]

FINDINGS: Right Kidney:

Length: 12.8 cm. Echogenicity within normal limits. No mass or
hydronephrosis visualized.

Left Kidney:

Length: 13.1 cm. Echogenicity within normal limits. No mass or
hydronephrosis visualized.

Bladder:

Appears normal for degree of bladder distention.
IMPRESSION: Normal study.  No cause for the patient's symptoms identified.

## 2019-09-05 NOTE — Pre-Procedure Instructions (Signed)
[place] ([place]), [place] - [address] [place] ([place]) [place] [address] Phone: [phone] Fax: [phone]      Your procedure is scheduled on  09-08-19  Report to [facility] Main Entrance "A" at 6 A.M., and check in at the Admitting office.  Call this number if you have problems the morning of surgery:  [phone]  Call [phone] if you have any questions prior to your surgery date Monday-Friday 8am-4pm    Remember:  Do not eat or drink after midnight the night before your surgery  You may drink clear liquids until 5AM the morning of your surgery.   Clear liquids allowed are: Water, Non-Citrus Juices (without pulp), Carbonated Beverages, Clear Tea, Black Coffee Only, and Gatorade  Please complete your PRE-SURGERY ENSURE that was provided to you by 5AM. the morning of surgery.  Please, if able, drink it in one setting. DO NOT SIP.   Take these medicines the morning of surgery with A SIP OF WATER : acetaminophen (TYLENOL) as needed oxyCODONE (OXY IR/ROXICODONE)as needed  7 days prior to surgery STOP taking any Aspirin (unless otherwise instructed by your surgeon), Aleve, Naproxen, Ibuprofen, Motrin, Advil, Goody's, BC's, all herbal medications, fish oil, and all vitamins.    The Morning of Surgery  Do not wear jewelry, make-up or nail polish.  Do not wear lotions, powders, or perfumes/colognes, or deodorant  Do not shave 48 hours prior to surgery.  Men may shave face and neck.  Do not bring valuables to the hospital.  [facility] is not responsible for any belongings or valuables.  If you are a smoker, DO NOT Smoke 24 hours prior to surgery IF you wear a CPAP at night please bring your mask, tubing, and machine the morning of surgery   Remember that you must have someone to transport you home after your surgery, and remain with you for 24 hours if you are discharged the same day.   Contacts, glasses, hearing  aids, dentures or bridgework may not be worn into surgery.    Leave your suitcase in the car.  After surgery it may be brought to your room.  For patients admitted to the hospital, discharge time will be determined by your treatment team.  Patients discharged the day of surgery will not be allowed to drive home.    Special instructions:   [facility]- Preparing For Surgery  Before surgery, you can play an important role. Because skin is not sterile, your skin needs to be as free of germs as possible. You can reduce the number of germs on your skin by washing with CHG (chlorahexidine gluconate) Soap before surgery.  CHG is an antiseptic cleaner which kills germs and bonds with the skin to continue killing germs even after washing.    Oral Hygiene is also important to reduce your risk of infection.  Remember - BRUSH YOUR TEETH THE MORNING OF SURGERY WITH YOUR REGULAR TOOTHPASTE  Please do not use if you have an allergy to CHG or antibacterial soaps. If your skin becomes reddened/irritated stop using the CHG.  Do not shave (including legs and underarms) for at least 48 hours prior to first CHG shower. It is OK to shave your face.  Please follow these instructions carefully.   1. Shower the NIGHT BEFORE SURGERY and the MORNING OF SURGERY with CHG Soap.   2. If you chose to wash your hair, wash your hair first as usual with your normal shampoo.  3. After you shampoo, rinse your hair and body thoroughly to remove the shampoo.  4. Use CHG as you would any other liquid soap. You can apply CHG directly to the skin and wash gently with a scrungie or a clean washcloth.   5. Apply the CHG Soap to your body ONLY FROM THE NECK DOWN.  Do not use on open wounds or open sores. Avoid contact with your eyes, ears, mouth and genitals (private parts). Wash Face and genitals (private parts)  with your normal soap.   6. Wash thoroughly, paying special attention to the area where your surgery will be  performed.  7. Thoroughly rinse your body with warm water from the neck down.  8. DO NOT shower/wash with your normal soap after using and rinsing off the CHG Soap.  9. Pat yourself dry with a CLEAN TOWEL.  10. Wear CLEAN PAJAMAS to bed the night before surgery, wear comfortable clothes the morning of surgery  11. Place CLEAN SHEETS on your bed the night of your first shower and DO NOT SLEEP WITH PETS.  Day of Surgery:  Do not apply any deodorants/lotions. Please shower the morning of surgery with the CHG soap  Please wear clean clothes to the hospital/surgery center.   Remember to brush your teeth WITH YOUR REGULAR TOOTHPASTE.   Please read over the  fact sheets that you were given.

## 2019-09-06 ENCOUNTER — Inpatient Hospital Stay (HOSPITAL_COMMUNITY)
Admission: RE | Admit: 2019-09-06 | Discharge: 2019-09-06 | Disposition: A | Discharge status: Home / Self Care | Payer: Medicaid Other | Source: Ambulatory Visit

## 2019-09-06 ENCOUNTER — Encounter (HOSPITAL_COMMUNITY): Payer: Self-pay

## 2019-09-06 ENCOUNTER — Other Ambulatory Visit: Payer: Self-pay

## 2019-09-06 ENCOUNTER — Other Ambulatory Visit (HOSPITAL_COMMUNITY)
Admission: RE | Admit: 2019-09-06 | Discharge: 2019-09-06 | Disposition: A | Discharge status: Home / Self Care | Payer: Medicaid Other | Source: Ambulatory Visit | Attending: Obstetrics and Gynecology | Admitting: Obstetrics and Gynecology

## 2019-09-06 DIAGNOSIS — Z20828 Contact with and (suspected) exposure to other viral communicable diseases: Secondary | ICD-10-CM | POA: Diagnosis not present

## 2019-09-06 DIAGNOSIS — Z01812 Encounter for preprocedural laboratory examination: Secondary | ICD-10-CM | POA: Insufficient documentation

## 2019-09-06 LAB — SARS CORONAVIRUS 2 (TAT 6-24 HRS): SARS Coronavirus 2: NEGATIVE

## 2019-09-06 NOTE — Pre-Procedure Instructions (Signed)
[place] ([place]), [place] - [address] [place] ([place]) [place] [address] Phone: [phone] Fax: [phone]      Your procedure is scheduled on  09-08-19  Report to [facility] Main Entrance "A" at 6 A.M., and check in at the Admitting office.  Call this number if you have problems the morning of surgery:  [phone]  Call [phone] if you have any questions prior to your surgery date Monday-Friday 8am-4pm    Remember:  Do not eat or drink after midnight the night before your surgery  You may drink clear liquids until 5AM the morning of your surgery.   Clear liquids allowed are: Water, Non-Citrus Juices (without pulp), Carbonated Beverages, Clear Tea, Black Coffee Only, and Gatorade    Take these medicines the morning of surgery with A SIP OF WATER : acetaminophen (TYLENOL) as needed oxyCODONE (OXY IR/ROXICODONE)as needed  STOP now taking any Aspirin (unless otherwise instructed by your surgeon), Aleve, Naproxen, Ibuprofen, Motrin, Advil, Goody's, BC's, all herbal medications, fish oil, and all vitamins.    The Morning of Surgery  Do not wear jewelry, make-up or nail polish.  Do not wear lotions, powders, perfumes or deodorant  Do not shave 48 hours prior to surgery.    Do not bring valuables to the hospital.  [facility] is not responsible for any belongings or valuables.  If you are a smoker, DO NOT Smoke 24 hours prior to surgery  Remember that you must have someone to transport you home after your surgery, and remain with you for 24 hours if you are discharged the same day.  Contacts, glasses, hearing aids, dentures or bridgework may not be worn into surgery.   Leave your suitcase in the car.  After surgery it may be brought to your room.  For patients admitted to the hospital, discharge time will be determined by your treatment team.  Patients discharged the day of surgery will not be allowed to drive  home.    Special instructions:   [facility]- Preparing For Surgery  Before surgery, you can play an important role. Because skin is not sterile, your skin needs to be as free of germs as possible. You can reduce the number of germs on your skin by washing with CHG (chlorahexidine gluconate) Soap before surgery.  CHG is an antiseptic cleaner which kills germs and bonds with the skin to continue killing germs even after washing.    Oral Hygiene is also important to reduce your risk of infection.  Remember - BRUSH YOUR TEETH THE MORNING OF SURGERY WITH YOUR REGULAR TOOTHPASTE  Please do not use if you have an allergy to CHG or antibacterial soaps. If your skin becomes reddened/irritated stop using the CHG.  Do not shave (including legs and underarms) for at least 48 hours prior to first CHG shower. It is OK to shave your face.  Please follow these instructions carefully.   1. Shower the NIGHT BEFORE SURGERY Fri and the MORNING OF SURGERY Mon with CHG Soap.   2. If you chose to wash your hair, wash your hair first as usual with your normal shampoo.  3. After you shampoo, rinse your hair and body thoroughly to remove the shampoo.  4. Use CHG as you would any other liquid soap. You can apply CHG directly to the skin and wash gently with a scrungie or a clean washcloth.   5. Apply the CHG Soap to your body ONLY FROM THE  NECK DOWN.  Do not use on open wounds or open sores. Avoid contact with your eyes, ears, mouth and genitals (private parts). Wash Face and genitals (private parts)  with your normal soap.   6. Wash thoroughly, paying special attention to the area where your surgery will be performed.  7. Thoroughly rinse your body with warm water from the neck down.  8. DO NOT shower/wash with your normal soap after using and rinsing off the CHG Soap.  9. Pat yourself dry with a CLEAN TOWEL.  10. Wear CLEAN PAJAMAS to bed the night before surgery, wear comfortable clothes the morning of  surgery  11. Place CLEAN SHEETS on your bed the night of your first shower and DO NOT SLEEP WITH PETS.  Day of Surgery:  Do not apply any deodorants/lotions. Please shower the morning of surgery with the CHG soap  Please wear clean clothes to the hospital/surgery center.   Remember to brush your teeth WITH YOUR REGULAR TOOTHPASTE.   Please read over the  fact sheets that you were given.

## 2019-09-06 NOTE — Progress Notes (Signed)
Patient denies shortness of breath, fever, cough and chest pain.  PCP -  None Cardiologist - Denies  Chest x-ray - Denies EKG - Denies Stress Test - Denies ECHO - Denies Cardiac Cath - Denies  ERAS: Clears til 5 am DOS, no drink.  Stop now all vitamin supplements and herbal supplements.  Anesthesia review: Yes  Coronavirus Screening Have you experienced the following symptoms:  Cough yes/no: No Fever (>100.51F)  yes/no: No Runny nose yes/no: No Sore throat yes/no: No Difficulty breathing/shortness of breath  yes/no: No  Have you traveled in the last 14 days and where? yes/no: No   Covid test on 09/06/19 before 12 noon.   Patient verbalized understanding of instructions that were given to them via phone.

## 2019-09-07 ENCOUNTER — Encounter (HOSPITAL_COMMUNITY): Payer: Self-pay | Admitting: Certified Registered Nurse Anesthetist

## 2019-09-07 NOTE — Anesthesia Preprocedure Evaluation (Addendum)
Anesthesia Evaluation  Patient identified by MRN, date of birth, ID band Patient awake    Reviewed: Allergy & Precautions, NPO status , Patient's Chart, lab work & pertinent test results  Airway Mallampati: II  TM Distance: >3 FB     Dental   Pulmonary Current Smoker,    breath sounds clear to auscultation       Cardiovascular hypertension,  Rhythm:Regular Rate:Normal     Neuro/Psych    GI/Hepatic negative GI ROS, Neg liver ROS,   Endo/Other  negative endocrine ROS  Renal/GU negative Renal ROS     Musculoskeletal   Abdominal   Peds  Hematology   Anesthesia Other Findings   Reproductive/Obstetrics                            Anesthesia Physical Anesthesia Plan  ASA: III  Anesthesia Plan: General   Post-op Pain Management:    Induction: Intravenous  PONV Risk Score and Plan: 2 and Ondansetron, Dexamethasone and Midazolam  Airway Management Planned: Oral ETT  Additional Equipment:   Intra-op Plan:   Post-operative Plan: Possible Post-op intubation/ventilation  Informed Consent: I have reviewed the patients History and Physical, chart, labs and discussed the procedure including the risks, benefits and alternatives for the proposed anesthesia with the patient or authorized representative who has indicated his/her understanding and acceptance.     Dental advisory given  Plan Discussed with: Anesthesiologist and CRNA  Anesthesia Plan Comments:        Anesthesia Quick Evaluation

## 2019-09-08 ENCOUNTER — Other Ambulatory Visit: Payer: Self-pay

## 2019-09-08 ENCOUNTER — Encounter (HOSPITAL_COMMUNITY)
Admission: RE | Disposition: A | Discharge status: Home / Self Care | Payer: Self-pay | Source: Home / Self Care | Attending: Obstetrics and Gynecology

## 2019-09-08 ENCOUNTER — Ambulatory Visit (HOSPITAL_COMMUNITY)
Admission: RE | Admit: 2019-09-08 | Discharge: 2019-09-08 | Disposition: A | Discharge status: Home / Self Care | Payer: Medicaid Other | Attending: Obstetrics and Gynecology | Admitting: Obstetrics and Gynecology

## 2019-09-08 ENCOUNTER — Ambulatory Visit (HOSPITAL_COMMUNITY): Payer: Medicaid Other | Admitting: Anesthesiology

## 2019-09-08 ENCOUNTER — Encounter (HOSPITAL_COMMUNITY): Payer: Self-pay | Admitting: Anesthesiology

## 2019-09-08 ENCOUNTER — Ambulatory Visit (HOSPITAL_COMMUNITY): Payer: Medicaid Other | Admitting: Physician Assistant

## 2019-09-08 DIAGNOSIS — G43909 Migraine, unspecified, not intractable, without status migrainosus: Secondary | ICD-10-CM | POA: Diagnosis not present

## 2019-09-08 DIAGNOSIS — Z881 Allergy status to other antibiotic agents status: Secondary | ICD-10-CM | POA: Insufficient documentation

## 2019-09-08 DIAGNOSIS — Z800 Family history of malignant neoplasm of digestive organs: Secondary | ICD-10-CM | POA: Diagnosis not present

## 2019-09-08 DIAGNOSIS — Z880 Allergy status to penicillin: Secondary | ICD-10-CM | POA: Diagnosis not present

## 2019-09-08 DIAGNOSIS — F17200 Nicotine dependence, unspecified, uncomplicated: Secondary | ICD-10-CM | POA: Insufficient documentation

## 2019-09-08 DIAGNOSIS — Z8249 Family history of ischemic heart disease and other diseases of the circulatory system: Secondary | ICD-10-CM | POA: Diagnosis not present

## 2019-09-08 DIAGNOSIS — Z823 Family history of stroke: Secondary | ICD-10-CM | POA: Diagnosis not present

## 2019-09-08 DIAGNOSIS — J45909 Unspecified asthma, uncomplicated: Secondary | ICD-10-CM | POA: Insufficient documentation

## 2019-09-08 DIAGNOSIS — Z91018 Allergy to other foods: Secondary | ICD-10-CM | POA: Diagnosis not present

## 2019-09-08 DIAGNOSIS — I10 Essential (primary) hypertension: Secondary | ICD-10-CM | POA: Diagnosis not present

## 2019-09-08 DIAGNOSIS — Z302 Encounter for sterilization: Secondary | ICD-10-CM | POA: Diagnosis not present

## 2019-09-08 DIAGNOSIS — Z833 Family history of diabetes mellitus: Secondary | ICD-10-CM | POA: Diagnosis not present

## 2019-09-08 DIAGNOSIS — Z9851 Tubal ligation status: Secondary | ICD-10-CM

## 2019-09-08 HISTORY — DX: Tubal ligation status: Z98.51

## 2019-09-08 HISTORY — PX: LAPAROSCOPIC BILATERAL SALPINGECTOMY: SHX5889

## 2019-09-08 LAB — CBC
HCT: 41.1 % (ref 36.0–46.0)
Hemoglobin: 13.2 g/dL (ref 12.0–15.0)
MCH: 29.3 pg (ref 26.0–34.0)
MCHC: 32.1 g/dL (ref 30.0–36.0)
MCV: 91.1 fL (ref 80.0–100.0)
Platelets: 272 10*3/uL (ref 150–400)
RBC: 4.51 MIL/uL (ref 3.87–5.11)
RDW: 13.1 % (ref 11.5–15.5)
WBC: 6 10*3/uL (ref 4.0–10.5)
nRBC: 0 % (ref 0.0–0.2)

## 2019-09-08 LAB — POCT PREGNANCY, URINE: Preg Test, Ur: NEGATIVE

## 2019-09-08 LAB — TYPE AND SCREEN
ABO/RH(D): O POS
Antibody Screen: NEGATIVE

## 2019-09-08 SURGERY — SALPINGECTOMY, BILATERAL, LAPAROSCOPIC
Anesthesia: General | Site: Abdomen | Laterality: Bilateral

## 2019-09-08 MED ORDER — FENTANYL CITRATE (PF) 100 MCG/2ML IJ SOLN
INTRAMUSCULAR | Status: DC | PRN
  Administered 2019-09-08: 100 ug via INTRAVENOUS
  Administered 2019-09-08 (×2): 50 ug via INTRAVENOUS

## 2019-09-08 MED ORDER — MIDAZOLAM HCL 2 MG/2ML IJ SOLN
INTRAMUSCULAR | Status: AC
  Filled 2019-09-08: qty 2

## 2019-09-08 MED ORDER — ONDANSETRON HCL 4 MG/2ML IJ SOLN
INTRAMUSCULAR | Status: DC | PRN
  Administered 2019-09-08: 4 mg via INTRAVENOUS

## 2019-09-08 MED ORDER — PROPOFOL 10 MG/ML IV BOLUS
INTRAVENOUS | Status: AC
  Filled 2019-09-08: qty 40

## 2019-09-08 MED ORDER — PROPOFOL 10 MG/ML IV BOLUS
INTRAVENOUS | Status: DC | PRN
  Administered 2019-09-08: 200 mg via INTRAVENOUS

## 2019-09-08 MED ORDER — LIDOCAINE 2% (20 MG/ML) 5 ML SYRINGE
INTRAMUSCULAR | Status: AC
  Filled 2019-09-08: qty 5

## 2019-09-08 MED ORDER — DEXAMETHASONE SODIUM PHOSPHATE 10 MG/ML IJ SOLN
INTRAMUSCULAR | Status: AC
  Filled 2019-09-08: qty 1

## 2019-09-08 MED ORDER — BUPIVACAINE HCL (PF) 0.25 % IJ SOLN
INTRAMUSCULAR | Status: AC
  Filled 2019-09-08: qty 30

## 2019-09-08 MED ORDER — DEXAMETHASONE SODIUM PHOSPHATE 4 MG/ML IJ SOLN
INTRAMUSCULAR | Status: DC | PRN
  Administered 2019-09-08: 10 mg via INTRAVENOUS

## 2019-09-08 MED ORDER — ROCURONIUM BROMIDE 10 MG/ML (PF) SYRINGE
PREFILLED_SYRINGE | INTRAVENOUS | Status: AC
  Filled 2019-09-08: qty 10

## 2019-09-08 MED ORDER — OXYCODONE HCL 5 MG PO TABS: 5.0000 mg | ORAL_TABLET | ORAL | 0 refills | Status: AC | PRN

## 2019-09-08 MED ORDER — SUCCINYLCHOLINE CHLORIDE 20 MG/ML IJ SOLN
INTRAMUSCULAR | Status: DC | PRN
  Administered 2019-09-08: 160 mg via INTRAVENOUS

## 2019-09-08 MED ORDER — ONDANSETRON HCL 4 MG/2ML IJ SOLN
INTRAMUSCULAR | Status: AC
  Filled 2019-09-08: qty 2

## 2019-09-08 MED ORDER — FENTANYL CITRATE (PF) 100 MCG/2ML IJ SOLN: 25.0000 ug | INTRAMUSCULAR | Status: DC | PRN

## 2019-09-08 MED ORDER — SUCCINYLCHOLINE CHLORIDE 200 MG/10ML IV SOSY
PREFILLED_SYRINGE | INTRAVENOUS | Status: AC
  Filled 2019-09-08: qty 10

## 2019-09-08 MED ORDER — MIDAZOLAM HCL 5 MG/5ML IJ SOLN
INTRAMUSCULAR | Status: DC | PRN
  Administered 2019-09-08: 2 mg via INTRAVENOUS

## 2019-09-08 MED ORDER — IBUPROFEN 600 MG PO TABS: 600.0000 mg | ORAL_TABLET | Freq: Four times a day (QID) | ORAL | 1 refills | Status: DC | PRN

## 2019-09-08 MED ORDER — LIDOCAINE 2% (20 MG/ML) 5 ML SYRINGE
INTRAMUSCULAR | Status: DC | PRN
  Administered 2019-09-08: 60 mg via INTRAVENOUS

## 2019-09-08 MED ORDER — ROCURONIUM BROMIDE 10 MG/ML (PF) SYRINGE
PREFILLED_SYRINGE | INTRAVENOUS | Status: DC | PRN
  Administered 2019-09-08: 40 mg via INTRAVENOUS

## 2019-09-08 MED ORDER — SUGAMMADEX SODIUM 500 MG/5ML IV SOLN
INTRAVENOUS | Status: AC
  Filled 2019-09-08: qty 5

## 2019-09-08 MED ORDER — FENTANYL CITRATE (PF) 100 MCG/2ML IJ SOLN
INTRAMUSCULAR | Status: AC
  Filled 2019-09-08: qty 2

## 2019-09-08 MED ORDER — BUPIVACAINE HCL (PF) 0.25 % IJ SOLN
INTRAMUSCULAR | Status: DC | PRN
  Administered 2019-09-08: 10 mL

## 2019-09-08 MED ORDER — KETOROLAC TROMETHAMINE 30 MG/ML IJ SOLN: 30.0000 mg | Freq: Once | INTRAMUSCULAR | Status: DC

## 2019-09-08 MED ORDER — SUGAMMADEX SODIUM 500 MG/5ML IV SOLN
INTRAVENOUS | Status: DC | PRN
  Administered 2019-09-08: 300 mg via INTRAVENOUS

## 2019-09-08 MED ORDER — FENTANYL CITRATE (PF) 250 MCG/5ML IJ SOLN
INTRAMUSCULAR | Status: AC
  Filled 2019-09-08: qty 5

## 2019-09-08 MED ORDER — LACTATED RINGERS IV SOLN
INTRAVENOUS | Status: DC
  Administered 2019-09-08: 07:00:00 via INTRAVENOUS

## 2019-09-08 SURGICAL SUPPLY — 37 items
ADH SKN CLS APL DERMABOND .7 (GAUZE/BANDAGES/DRESSINGS) ×1
BAG SPEC RTRVL LRG 6X4 10 (ENDOMECHANICALS)
CABLE HIGH FREQUENCY MONO STRZ (ELECTRODE) IMPLANT
CATH ROBINSON RED A/P 16FR (CATHETERS) IMPLANT
DECANTER SPIKE VIAL GLASS SM (MISCELLANEOUS) ×3 IMPLANT
DERMABOND ADVANCED (GAUZE/BANDAGES/DRESSINGS) ×2
DERMABOND ADVANCED .7 DNX12 (GAUZE/BANDAGES/DRESSINGS) ×1 IMPLANT
DRSG OPSITE POSTOP 3X4 (GAUZE/BANDAGES/DRESSINGS) IMPLANT
DURAPREP 26ML APPLICATOR (WOUND CARE) ×3 IMPLANT
FILTER SMOKE EVAC LAPAROSHD (FILTER) ×3 IMPLANT
GLOVE BIO SURGEON STRL SZ 6.5 (GLOVE) ×2 IMPLANT
GLOVE BIO SURGEONS STRL SZ 6.5 (GLOVE) ×1
GLOVE BIOGEL PI IND STRL 7.0 (GLOVE) ×1 IMPLANT
GLOVE BIOGEL PI INDICATOR 7.0 (GLOVE) ×2
GOWN STRL REUS W/ TWL LRG LVL3 (GOWN DISPOSABLE) ×2 IMPLANT
GOWN STRL REUS W/TWL LRG LVL3 (GOWN DISPOSABLE) ×6
KIT TURNOVER KIT B (KITS) ×3 IMPLANT
MANIPULATOR UTERINE 7CM CLEARV (MISCELLANEOUS) IMPLANT
NS IRRIG 1000ML POUR BTL (IV SOLUTION) ×3 IMPLANT
PACK LAPAROSCOPY BASIN (CUSTOM PROCEDURE TRAY) ×3 IMPLANT
PACK TRENDGUARD 450 HYBRID PRO (MISCELLANEOUS) IMPLANT
POUCH SPECIMEN RETRIEVAL 10MM (ENDOMECHANICALS) IMPLANT
PROTECTOR NERVE ULNAR (MISCELLANEOUS) ×6 IMPLANT
SET IRRIG TUBING LAPAROSCOPIC (IRRIGATION / IRRIGATOR) IMPLANT
SET TUBE SMOKE EVAC HIGH FLOW (TUBING) ×3 IMPLANT
SHEARS HARMONIC ACE PLUS 36CM (ENDOMECHANICALS) IMPLANT
SLEEVE ENDOPATH XCEL 5M (ENDOMECHANICALS) ×3 IMPLANT
SOLUTION ELECTROLUBE (MISCELLANEOUS) IMPLANT
SUT VIC AB 3-0 PS2 18 (SUTURE) ×3
SUT VIC AB 3-0 PS2 18XBRD (SUTURE) ×1 IMPLANT
SUT VICRYL 0 UR6 27IN ABS (SUTURE) IMPLANT
SYSTEM CARTER THOMASON II (TROCAR) IMPLANT
TOWEL GREEN STERILE FF (TOWEL DISPOSABLE) ×6 IMPLANT
TRENDGUARD 450 HYBRID PRO PACK (MISCELLANEOUS) ×3
TROCAR XCEL NON-BLD 11X100MML (ENDOMECHANICALS) IMPLANT
TROCAR XCEL NON-BLD 5MMX100MML (ENDOMECHANICALS) ×3 IMPLANT
WARMER LAPAROSCOPE (MISCELLANEOUS) ×3 IMPLANT

## 2019-09-08 NOTE — Anesthesia Procedure Notes (Signed)
Procedure Name: Intubation Date/Time: 09/08/2019 8:10 AM Performed by: Lieutenant Diego, CRNA Pre-anesthesia Checklist: Patient identified, Emergency Drugs available, Suction available and Patient being monitored Patient Re-evaluated:Patient Re-evaluated prior to induction Oxygen Delivery Method: Circle system utilized Preoxygenation: Pre-oxygenation with 100% oxygen Induction Type: IV induction Ventilation: Mask ventilation without difficulty Laryngoscope Size: Miller and 2 Grade View: Grade I Tube type: Oral Tube size: 7.0 mm Number of attempts: 1 Airway Equipment and Method: Stylet Placement Confirmation: ETT inserted through vocal cords under direct vision,  positive ETCO2 and breath sounds checked- equal and bilateral Secured at: 22 cm Tube secured with: Tape Dental Injury: Teeth and Oropharynx as per pre-operative assessment

## 2019-09-08 NOTE — H&P (Signed)
Raven Roberts is an 23 y.o.[id number] female presenting for scheduled laparoscopic bilateral tubal ligation. Pt reports completed family - requests permanent sterilization. She has been counseled and is aware of reversible long term options and also risk of regret  Pertinent Gynecological History: Menses: flow is moderate Contraception: none - 4 weeks postpartum DES exposure: unknown Blood transfusions: none Sexually transmitted diseases History of chlamydia and trichomonas Previous GYN Procedures: none  Last mammogram: n/a Date: Last pap: normal Date:01/2019 OB History: G5, P4014  Menstrual History: Menarche age: 7 No LMP recorded.      Past Medical History:  Diagnosis Date  . Admission for tubal ligation 07/25/2018  . Asthma   . Infection    uti, trich  . Migraines   . No pertinent past medical history   . Rectal pain, chronic          Past Surgical History:  Procedure Laterality Date  . DILATION AND EVACUATION N/A 04/18/2018   Procedure: DILATATION AND EVACUATION WITH ULTRASOUND GUIDANCE;  Surgeon: Janyth Contes, MD;  Location: [facility];  Service: Gynecology;  Laterality: N/A;  . NO PAST SURGERIES    . TOOTH EXTRACTION           Family History  Problem Relation Age of Onset  . Hypertension Mother   . Hypertension Father   . Diabetes Maternal Aunt   . Hypertension Maternal Aunt   . Diabetes Maternal Uncle   . Cancer Maternal Grandmother   . Hypertension Maternal Grandmother   . Cancer Maternal Grandfather        colon  . Hypertension Maternal Grandfather   . Stroke Maternal Grandfather     Social History:  reports that she has been smoking. She has never used smokeless tobacco. She reports current alcohol use. She reports previous drug use. Drug: Marijuana.  Allergies:       Allergies  Allergen Reactions  . Pineapple Anaphylaxis, Shortness Of Breath and Swelling  . Amoxicillin Rash and Other (See Comments)    Has  patient had a PCN reaction causing immediate rash, facial/tongue/throat swelling, SOB or lightheadedness with hypotension: NO . Patient states rash as a child Has patient had a PCN reaction causing severe rash involving mucus membranes or skin necrosis: No Has patient had a PCN reaction that required hospitalization No Has patient had a PCN reaction occurring within the last 10 years: No If all of the above answers are "NO", then may proceed with Cephalosporin use.  Marland Kitchen Penicillins Rash and Other (See Comments)    Has patient had a PCN reaction causing immediate rash, facial/tongue/throat swelling, SOB or lightheadedness with hypotension: NO.  Patient states rash as a child Has patient had a PCN reaction causing severe rash involving mucus membranes or skin necrosis: No Has patient had a PCN reaction that required hospitalization No Has patient had a PCN reaction occurring within the last 10 years: No If all of the above answers are "NO", then may proceed with Cephalosporin use.    No medications prior to admission.    Review of Systems  Constitutional: Negative for chills, fever, malaise/fatigue and weight loss.  Eyes: Negative for blurred vision.  Respiratory: Negative for shortness of breath.   Cardiovascular: Negative for chest pain.  Gastrointestinal: Negative for abdominal pain, heartburn, nausea and vomiting.  Genitourinary: Negative for dysuria.  Musculoskeletal: Negative for myalgias.  Skin: Negative for itching and rash.  Neurological: Negative for dizziness and headaches.  Endo/Heme/Allergies: Does not bruise/bleed easily.  Psychiatric/Behavioral: Negative for depression, hallucinations,  substance abuse and suicidal ideas. The patient is nervous/anxious.     unknown if currently breastfeeding. Physical Exam  Lab Results Last 24 Hours  No results found for this or any previous visit (from the past 24 hour(s)).    Imaging Results (Last 48 hours)  No results  found.    Assessment/Plan: [id number] with complete family here for permanent sterilization via laparoscopic bilateral tubal ligation Procedure reviewed; consent confirmed All questions answered NPO To OR when ready  Raven Roberts Raven Roberts 09/08/2019 at 5:58am        Routing History

## 2019-09-08 NOTE — Transfer of Care (Signed)
Immediate Anesthesia Transfer of Care Note  Patient: Raven Roberts  Procedure(s) Performed: LAPAROSCOPIC BILATERAL SALPINGECTOMY (Bilateral Abdomen)  Patient Location: PACU  Anesthesia Type:General  Level of Consciousness: awake  Airway & Oxygen Therapy: Patient Spontanous Breathing and Patient connected to face mask oxygen  Post-op Assessment: Report given to RN and Post -op Vital signs reviewed and stable  Post vital signs: Reviewed and stable  Last Vitals:  Vitals Value Taken Time  BP 143/87 09/08/19 0909  Temp    Pulse 81 09/08/19 0909  Resp 17 09/08/19 0909  SpO2 99 % 09/08/19 0909  Vitals shown include unvalidated device data.  Last Pain:  Vitals:   09/08/19 0731  TempSrc:   PainSc: 0-No pain      Patients Stated Pain Goal: 4 ([id number])  Complications: No apparent anesthesia complications

## 2019-09-08 NOTE — Op Note (Signed)
Operative Note    Preoperative Diagnosis: Desires bilateral tubal ligation   Postoperative Diagnosis: S/P Bilateral tubal fulgaration   Procedure: Laparoscopic bilateral tubal ligation/fulgaration   Surgeon: Mickle Mallory DO Assist: Candida Peeling MD  Anesthesia: General  Fluids:LR  EBL: <10cc UOP: 200cc IVF  Findings: Grossly normal uterus, tubes and ovaries. Mild intraabdominal adhesions to lateral sidewall on left   Specimen: none   Procedure Note Patient was taken to the operating room where general anesthesia was obtained without difficulty. She was then prepped and draped in the normal sterile fashion in the dorsal lithotomy position. An appropriate timeout was performed. A speculum was then placed within the vagina and a ClearVue uterine manipulator placed after uterus sounded to 10 cm. The bladder was emptied. Attention was then turned to the patient's abdomen after draping where the infraumbilicus was incised and a 55mm scope placed under direct visualization. Gas flow was then applied and a pneumoperitoneum obtained with approximate 3 L of CO2 gas. With patient in Trendelenburg the uterus and tubes and ovaries were inspected with findings as previously stated. Two other 88mm trocars were placed under direct visualization the left and right lower quadrants. Both fallopian tubes were easily identified with the patient in moderate trendelenberg. They were followed to their fimbriated end.  A harmonic scalpel was then introduced through the operative scope and the left fallopian tube grasped approximately 3 cm from the uterine cornua and cauterized sequentially for 2-3cm with good blanching noted. Dr Lennox Solders then then grasped the right fallopian tube and  likewise grasped it proximally 3 cm from the uterine cornua and cauterized it. The remainder of the pelvis and abdomen were inspected and found to be normal. The patient was then flattened and all instruments were removed from the  abdomen and the pneumoperitoneum reduced through the operative trocar. The trocar was finally removed and the infraumbilical incision closed with 4-0 Vicryl and dermabond. The suprapubic incision was also closed with 4-0 vicryl and a bandage. The ClearVue manipulator was removed- hemostasis noted. Patient was then awakened and taken to the recovery room in good condition. Counts were correct x2

## 2019-09-08 NOTE — Discharge Instructions (Signed)
Call office with any concerns [phone] 

## 2019-09-08 NOTE — Anesthesia Postprocedure Evaluation (Signed)
Anesthesia Post Note  Patient: Raven Roberts  Procedure(s) Performed: LAPAROSCOPIC BILATERAL SALPINGECTOMY (Bilateral Abdomen)     Patient location during evaluation: PACU Anesthesia Type: General Level of consciousness: awake Pain management: pain level controlled Vital Signs Assessment: post-procedure vital signs reviewed and stable Respiratory status: spontaneous breathing Cardiovascular status: stable Postop Assessment: no apparent nausea or vomiting Anesthetic complications: no    Last Vitals:  Vitals:   09/08/19 0923 09/08/19 0935  BP: 135/90 127/86  Pulse: 66 68  Resp: 18 15  Temp: (!) 36.4 C   SpO2: 97% 99%    Last Pain:  Vitals:   09/08/19 0923  TempSrc:   PainSc: 3                  Eunice Oldaker

## 2019-09-09 ENCOUNTER — Encounter (HOSPITAL_COMMUNITY): Payer: Self-pay | Admitting: Obstetrics and Gynecology

## 2019-09-11 ENCOUNTER — Encounter (HOSPITAL_COMMUNITY): Payer: Self-pay | Admitting: *Deleted

## 2019-09-11 ENCOUNTER — Other Ambulatory Visit: Payer: Self-pay

## 2019-09-11 ENCOUNTER — Emergency Department (HOSPITAL_COMMUNITY)
Admission: EM | Admit: 2019-09-11 | Discharge: 2019-09-11 | Disposition: A | Discharge status: Home / Self Care | Payer: Medicaid Other | Attending: Emergency Medicine | Admitting: Emergency Medicine

## 2019-09-11 DIAGNOSIS — M79602 Pain in left arm: Secondary | ICD-10-CM | POA: Diagnosis not present

## 2019-09-11 DIAGNOSIS — Z5321 Procedure and treatment not carried out due to patient leaving prior to being seen by health care provider: Secondary | ICD-10-CM | POA: Diagnosis not present

## 2019-09-11 DIAGNOSIS — R2232 Localized swelling, mass and lump, left upper limb: Secondary | ICD-10-CM | POA: Insufficient documentation

## 2019-09-11 NOTE — ED Triage Notes (Signed)
Pt states she had her tubes tied on Friday, she was told the gas could move to her arms, swelling and pain in left arm today. No other symptoms

## 2019-09-12 ENCOUNTER — Other Ambulatory Visit: Payer: Self-pay

## 2019-09-12 ENCOUNTER — Emergency Department (HOSPITAL_COMMUNITY)
Admission: EM | Admit: 2019-09-12 | Discharge: 2019-09-12 | Disposition: A | Discharge status: Home / Self Care | Payer: Medicaid Other | Attending: Emergency Medicine | Admitting: Emergency Medicine

## 2019-09-12 DIAGNOSIS — Z5321 Procedure and treatment not carried out due to patient leaving prior to being seen by health care provider: Secondary | ICD-10-CM | POA: Diagnosis not present

## 2019-09-12 DIAGNOSIS — M79602 Pain in left arm: Secondary | ICD-10-CM | POA: Diagnosis not present

## 2019-09-12 DIAGNOSIS — Z9889 Other specified postprocedural states: Secondary | ICD-10-CM | POA: Insufficient documentation

## 2019-09-12 DIAGNOSIS — R2232 Localized swelling, mass and lump, left upper limb: Secondary | ICD-10-CM | POA: Insufficient documentation

## 2019-09-12 IMAGING — US US OB TRANSVAGINAL
1 series · 15 of 28 positions shown · non-contrast
Comparison: 04/07/2018

CLINICAL DATA: Vaginal bleeding.  Recent spontaneous abortion.

EXAM:
ULTRASOUND PELVIS TRANSVAGINAL
TECHNIQUE: Transvaginal ultrasound examination of the pelvis was performed
including evaluation of the uterus, ovaries, adnexal regions, and
pelvic cul-de-sac.

[Series 1: us ob transvaginal · 39 acquisitions, 15 frames shown]
[im 1/39]
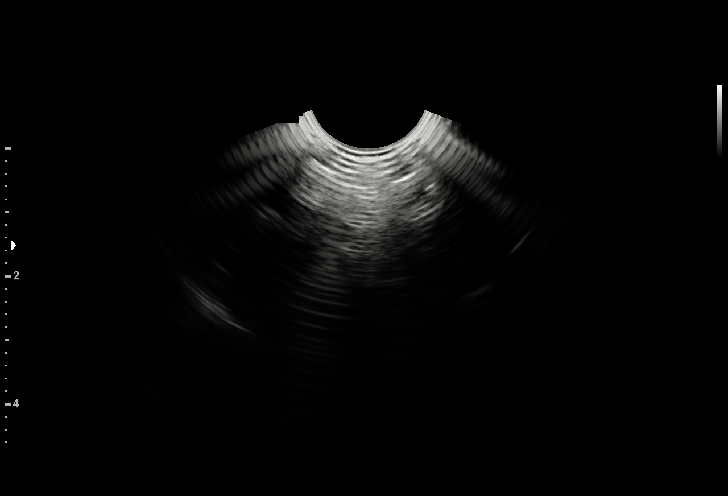
[im 3/39]
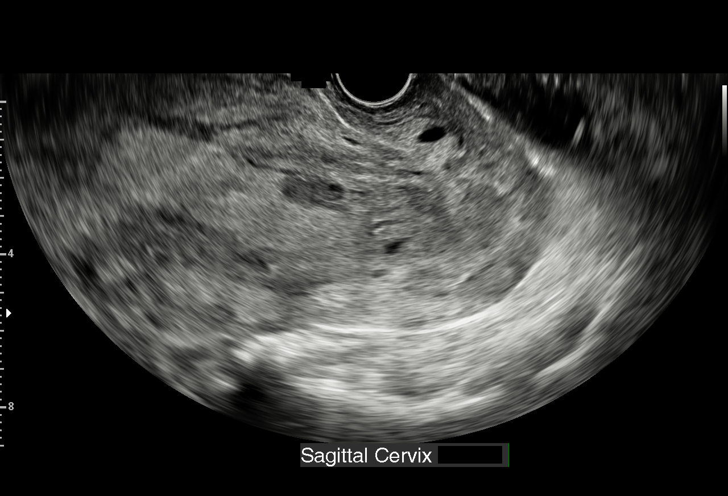
[im 6/39]
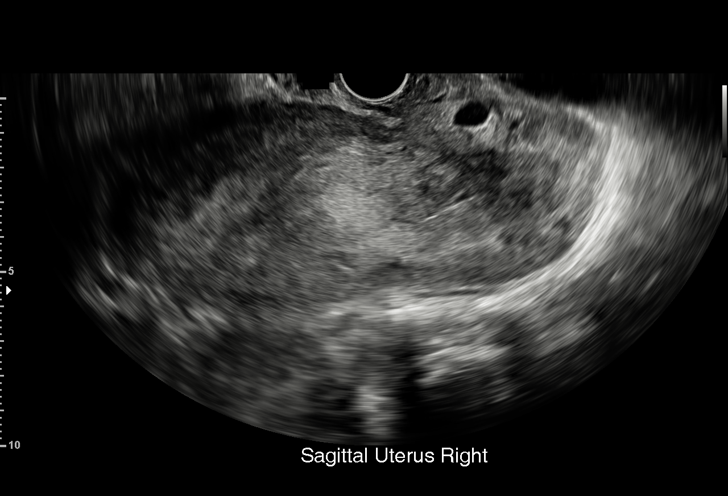
[im 9/39]
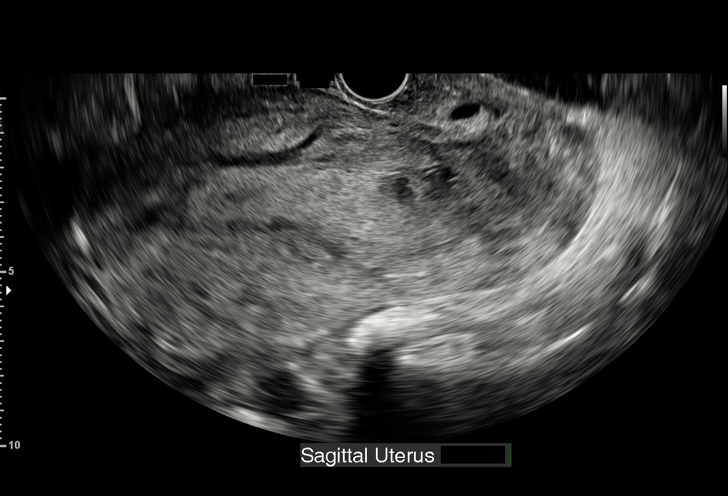
[im 12/39]
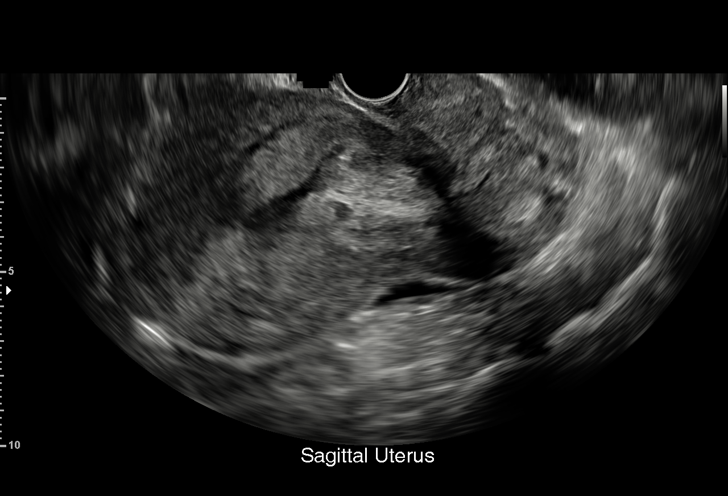
[im 15/39]
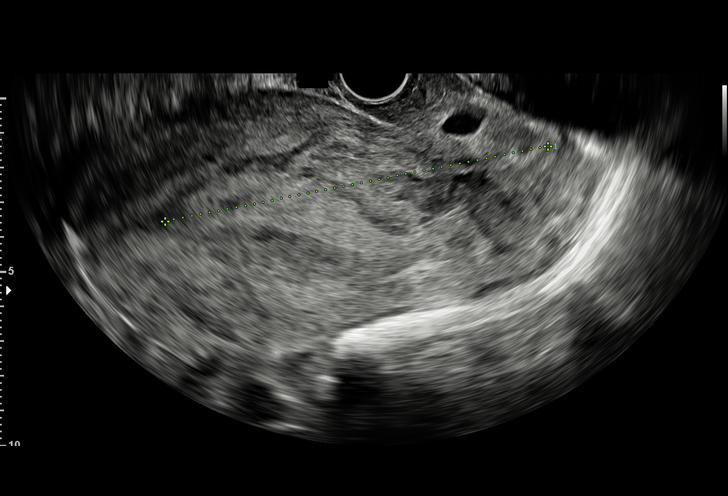
[im 17/39]
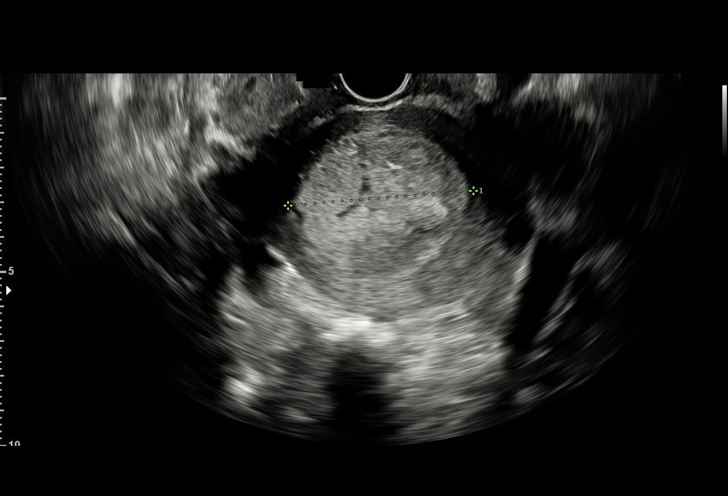
[im 20/39]
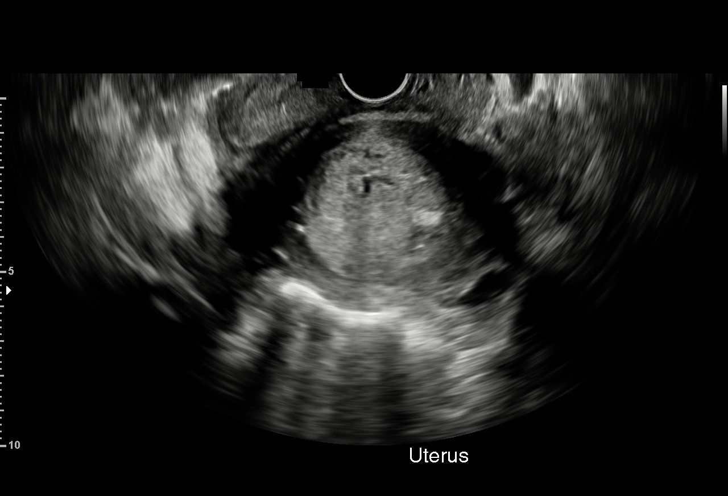
[im 22/39]
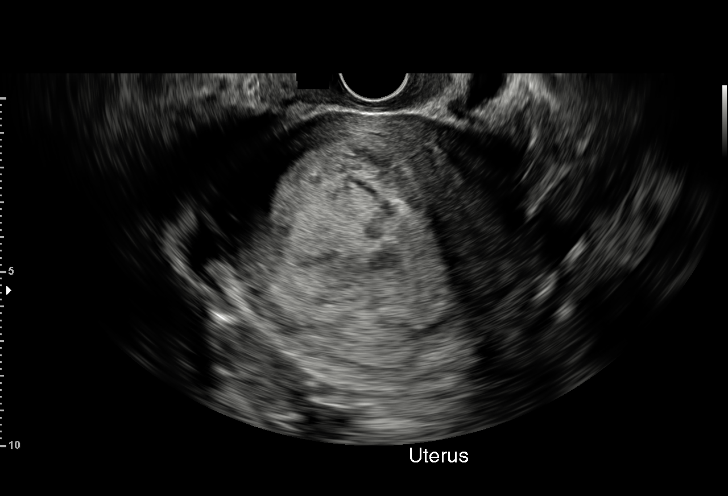
[im 24/39]
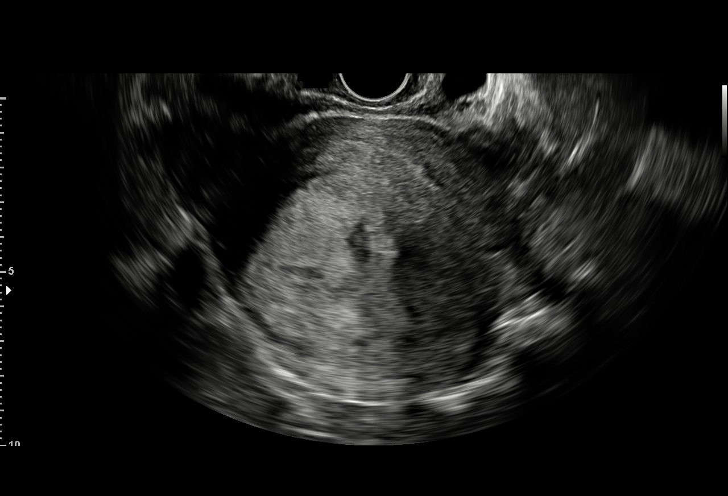
[im 27/39]
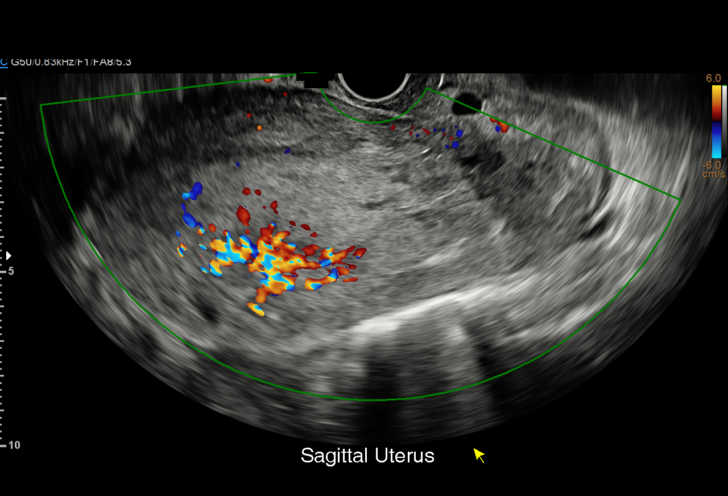
[im 30/39]
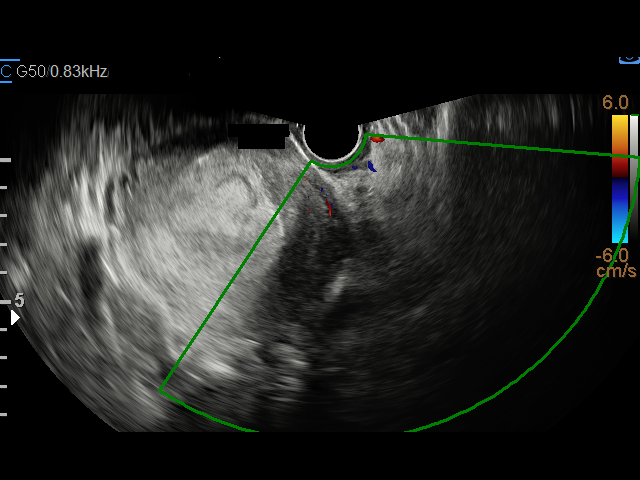
[im 33/39]
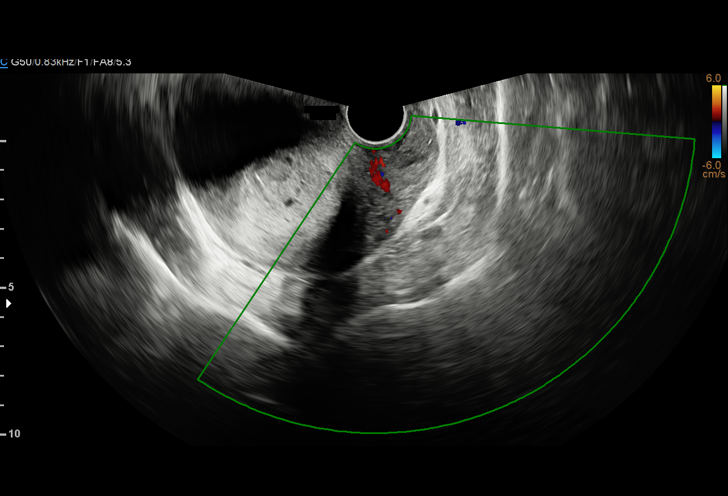
[im 36/39]
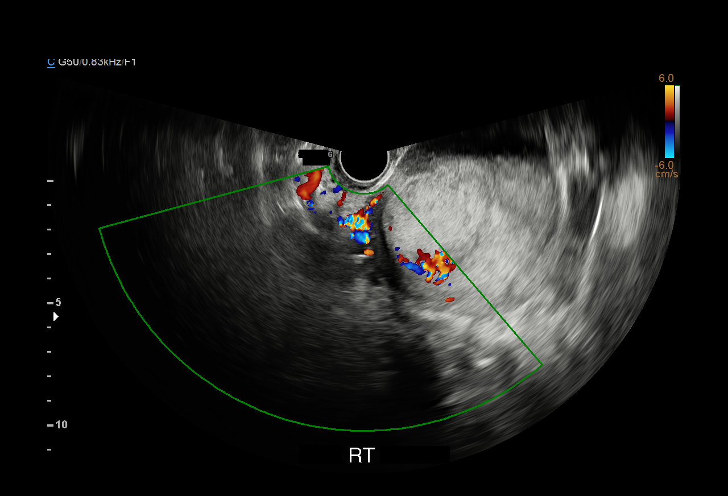
[im 39/39]
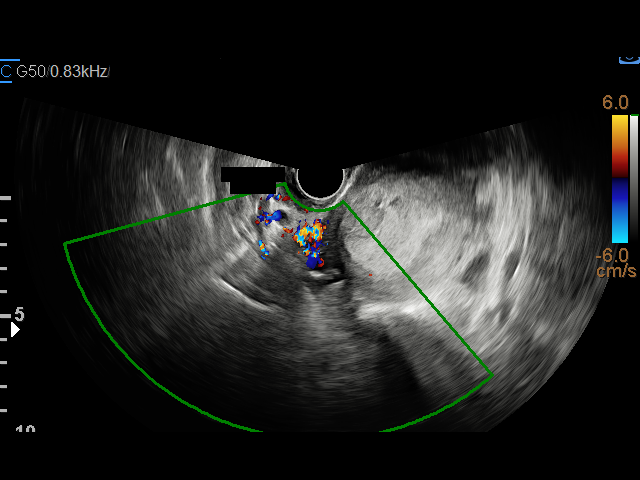

[15 of 28 positions shown; findings below may reference images not displayed]

FINDINGS: Uterus

Measurements: 14.7 x 7.0 by 9.0 cm.  No fibroids identified.

Endometrium

Previously seen IUP is no longer visualized. Heterogeneous
hyperechoic material is seen within the endometrial cavity measuring
up to 30 mm in thickness in the lower uterine segment. Internal
blood flow is seen on color Doppler ultrasound in the posterior
corpus and fundus, where there is possible extension into the
adjacent myometrium. These findings are suspicious for retained
products of conception.

Right ovary

Measurements: Not visualized, however no adnexal mass identified.

Left ovary

Measurements: Not visualized, however no adnexal mass identified.

Other findings:  No abnormal free fluid
IMPRESSION: Findings suspicious for retained products of conception, as
described above.

## 2019-09-12 NOTE — ED Triage Notes (Signed)
Pt presents with Left arm pain and swelling x3 days, pt states she had a tubal ligation Friday. Left arm is where her IV was placed. They sent her here to r/o DVT. Pt needs a Dr's note to return to work. No SOB/CP

## 2019-09-12 NOTE — ED Notes (Signed)
Called multiply times. Unable to locate in waiting room.

## 2019-09-14 ENCOUNTER — Other Ambulatory Visit: Payer: Self-pay

## 2019-09-14 ENCOUNTER — Emergency Department (HOSPITAL_BASED_OUTPATIENT_CLINIC_OR_DEPARTMENT_OTHER): Payer: Medicaid Other

## 2019-09-14 ENCOUNTER — Emergency Department (HOSPITAL_BASED_OUTPATIENT_CLINIC_OR_DEPARTMENT_OTHER)
Admission: EM | Admit: 2019-09-14 | Discharge: 2019-09-14 | Disposition: A | Discharge status: Home / Self Care | Payer: Medicaid Other | Attending: Emergency Medicine | Admitting: Emergency Medicine

## 2019-09-14 ENCOUNTER — Encounter (HOSPITAL_BASED_OUTPATIENT_CLINIC_OR_DEPARTMENT_OTHER): Payer: Self-pay

## 2019-09-14 DIAGNOSIS — F1210 Cannabis abuse, uncomplicated: Secondary | ICD-10-CM | POA: Insufficient documentation

## 2019-09-14 DIAGNOSIS — M79602 Pain in left arm: Secondary | ICD-10-CM | POA: Insufficient documentation

## 2019-09-14 DIAGNOSIS — R2232 Localized swelling, mass and lump, left upper limb: Secondary | ICD-10-CM | POA: Diagnosis present

## 2019-09-14 DIAGNOSIS — F17210 Nicotine dependence, cigarettes, uncomplicated: Secondary | ICD-10-CM | POA: Diagnosis not present

## 2019-09-14 NOTE — ED Triage Notes (Addendum)
Pt c/o swelling, numbness, pain to LE x 4 days-denies injury-states she had IV in left hand for surgery 10/30 -states she LWBS [facility] and [facility] ED this week due to wait times-pt playing video game using both hands without difficulty -NAD-steady gait

## 2019-09-14 NOTE — Discharge Instructions (Addendum)
You were evaluated in the Emergency Department and after careful evaluation, we did not find any emergent condition requiring admission or further testing in the hospital.  Your exam/testing today was overall reassuring.  We recommend Tylenol or ibuprofen for discomfort and warm compresses to the painful area.  Please return to the Emergency Department if you experience any worsening of your condition.  We encourage you to follow up with a primary care provider.  Thank you for allowing [place] to be a part of your care.

## 2019-09-14 NOTE — ED Provider Notes (Signed)
[facility] Emergency Department Provider Note MRN:  [id number]  Arrival date & time: 09/14/19     Chief Complaint   Edema   History of Present Illness   Raven Roberts is a 23 y.o. year-old female with a history of asthma presenting to the ED with chief complaint of edema.  Patient had surgery for tubal ligation on 10-30.  About 2 days later, she began experiencing pain, swelling to the left arm.  Worse with use of the left arm.  Denies chest pain, no shortness of breath, no fever, no neck pain, no back pain, no abdominal pain, no vaginal bleeding or discharge.  Pain is constant, moderate, no other exacerbating or alleviating factors.  Review of Systems  A complete 10 system review of systems was obtained and all systems are negative except as noted in the HPI and PMH.   Patient's Health History    Past Medical History:  Diagnosis Date  . Asthma    no inhaler, no current problems  . Migraines    otc med prn  . Rectal pain, chronic   . S/P tubal ligation 09/08/2019    Past Surgical History:  Procedure Laterality Date  . DILATION AND CURETTAGE OF UTERUS    . DILATION AND EVACUATION N/A 04/18/2018   Procedure: DILATATION AND EVACUATION WITH ULTRASOUND GUIDANCE;  Surgeon: Janyth Contes, MD;  Location: [facility];  Service: Gynecology;  Laterality: N/A;  . LAPAROSCOPIC BILATERAL SALPINGECTOMY Bilateral 09/08/2019   Procedure: LAPAROSCOPIC BILATERAL SALPINGECTOMY;  Surgeon: Sherlyn Hay, DO;  Location: [facility];  Service: Gynecology;  Laterality: Bilateral;  . TOOTH EXTRACTION     w/anesthesia  . TUBAL LIGATION      Family History  Problem Relation Age of Onset  . Hypertension Mother   . Hypertension Father   . Diabetes Maternal Aunt   . Hypertension Maternal Aunt   . Diabetes Maternal Uncle   . Cancer Maternal Grandmother   . Hypertension Maternal Grandmother   . Cancer Maternal Grandfather        colon  . Hypertension Maternal  Grandfather   . Stroke Maternal Grandfather     Social History   Socioeconomic History  . Marital status: Single    Spouse name: Not on file  . Number of children: 1  . Years of education: 12th  . Highest education level: Not on file  Occupational History  . Not on file  Social Needs  . Financial resource strain: Not on file  . Food insecurity    Worry: Not on file    Inability: Not on file  . Transportation needs    Medical: Not on file    Non-medical: Not on file  Tobacco Use  . Smoking status: Current Some Day Smoker    Types: Cigars  . Smokeless tobacco: Never Used  . Tobacco comment: Black and mild 3 cigars/day  Substance and Sexual Activity  . Alcohol use: Yes    Comment: occ  . Drug use: Yes    Types: Marijuana  . Sexual activity: Not on file  Lifestyle  . Physical activity    Days per week: Not on file    Minutes per session: Not on file  . Stress: Not on file  Relationships  . Social Herbalist on phone: Not on file    Gets together: Not on file    Attends religious service: Not on file    Active member of club or organization: Not  on file    Attends meetings of clubs or organizations: Not on file    Relationship status: Not on file  . Intimate partner violence    Fear of current or ex partner: Not on file    Emotionally abused: Not on file    Physically abused: Not on file    Forced sexual activity: Not on file  Other Topics Concern  . Not on file  Social History Narrative   Patient is single and lives at home with her son.    Patient unemployed.   Education high school.    Right handed.   Caffeine once per month one cup.     Physical Exam  Vital Signs and Nursing Notes reviewed Vitals:   09/14/19 1600  BP: (!) 130/96  Pulse: 85  Resp: 18  Temp: 98.8 F (37.1 C)  SpO2: 100%    CONSTITUTIONAL: Well-appearing, NAD NEURO:  Alert and oriented x 3, no focal deficits EYES:  eyes equal and reactive ENT/NECK:  no LAD, no JVD  CARDIO: Regular rate, well-perfused, normal S1 and S2 PULM:  CTAB no wheezing or rhonchi GI/GU:  normal bowel sounds, non-distended, non-tender MSK/SPINE:  No gross deformities, no edema SKIN:  no rash, atraumatic PSYCH:  Appropriate speech and behavior  Diagnostic and Interventional Summary    EKG Interpretation  Date/Time:    Ventricular Rate:    PR Interval:    QRS Duration:   QT Interval:    QTC Calculation:   R Axis:     Text Interpretation:        Labs Reviewed - No data to display  US Venous Img Upper Uni Left  Final Result      Medications - No data to display   Procedures  /  Critical Care Procedures  ED Course and Medical Decision Making  I have reviewed the triage vital signs and the nursing notes.  Pertinent labs & imaging results that were available during my care of the patient were reviewed by me and considered in my medical decision making (see below for details).  Patient's left arm was used for IV for surgery, considering superficial thrombus.  No palpable thrombus, no rash, no evidence of infection.  Vital signs are reassuring.  Will ultrasound to exclude DVT given the postoperative status.  Ultrasound is negative for DVT, patient is appropriate for discharge with symptomatic management.  Barth Kirks. Sedonia Small, [place] [email or web address]  Final Clinical Impressions(s) / ED Diagnoses     ICD-10-CM   1. Pain of left upper extremity  M79.602     ED Discharge Orders    None       Discharge Instructions Discussed with and Provided to Patient:     Discharge Instructions     You were evaluated in the Emergency Department and after careful evaluation, we did not find any emergent condition requiring admission or further testing in the hospital.  Your exam/testing today was overall reassuring.  We recommend Tylenol or ibuprofen for discomfort and warm compresses to the painful area.  Please  return to the Emergency Department if you experience any worsening of your condition.  We encourage you to follow up with a primary care provider.  Thank you for allowing [place] to be a part of your care.        Maudie Flakes, MD 09/14/19 2036

## 2019-10-10 ENCOUNTER — Other Ambulatory Visit: Payer: Self-pay

## 2019-10-10 DIAGNOSIS — Z20822 Contact with and (suspected) exposure to covid-19: Secondary | ICD-10-CM

## 2019-10-12 LAB — NOVEL CORONAVIRUS, NAA: SARS-CoV-2, NAA: DETECTED — AB

## 2019-10-13 ENCOUNTER — Telehealth: Payer: Self-pay | Admitting: Nurse Practitioner

## 2019-10-13 NOTE — Telephone Encounter (Signed)
Called to Discuss with patient about Covid symptoms and the use of bamlanivimab, a monoclonal antibody infusion for those with mild to moderate Covid symptoms and at a high risk of hospitalization.     Pt is qualified for this infusion at the [facility] infusion center due to co-morbid conditions and/or a member of an at-risk group.     Patient is at-risk due to obesity.  Unable to reach pt

## 2020-06-25 ENCOUNTER — Ambulatory Visit (HOSPITAL_COMMUNITY)
Admission: EM | Admit: 2020-06-25 | Discharge: 2020-06-25 | Disposition: A | Discharge status: Home / Self Care | Payer: Medicaid Other | Attending: Physician Assistant | Admitting: Physician Assistant

## 2020-06-25 ENCOUNTER — Encounter (HOSPITAL_COMMUNITY): Payer: Self-pay

## 2020-06-25 DIAGNOSIS — R6883 Chills (without fever): Secondary | ICD-10-CM | POA: Insufficient documentation

## 2020-06-25 DIAGNOSIS — Z87891 Personal history of nicotine dependence: Secondary | ICD-10-CM | POA: Insufficient documentation

## 2020-06-25 DIAGNOSIS — Z20822 Contact with and (suspected) exposure to covid-19: Secondary | ICD-10-CM | POA: Insufficient documentation

## 2020-06-25 DIAGNOSIS — J029 Acute pharyngitis, unspecified: Secondary | ICD-10-CM | POA: Diagnosis present

## 2020-06-25 DIAGNOSIS — J028 Acute pharyngitis due to other specified organisms: Secondary | ICD-10-CM | POA: Insufficient documentation

## 2020-06-25 DIAGNOSIS — Z9079 Acquired absence of other genital organ(s): Secondary | ICD-10-CM | POA: Insufficient documentation

## 2020-06-25 DIAGNOSIS — J45909 Unspecified asthma, uncomplicated: Secondary | ICD-10-CM | POA: Insufficient documentation

## 2020-06-25 DIAGNOSIS — Z880 Allergy status to penicillin: Secondary | ICD-10-CM | POA: Insufficient documentation

## 2020-06-25 DIAGNOSIS — B9789 Other viral agents as the cause of diseases classified elsewhere: Secondary | ICD-10-CM | POA: Diagnosis not present

## 2020-06-25 LAB — POCT RAPID STREP A, ED / UC: Streptococcus, Group A Screen (Direct): NEGATIVE

## 2020-06-25 MED ORDER — IBUPROFEN 600 MG PO TABS
600.0000 mg | ORAL_TABLET | Freq: Four times a day (QID) | ORAL | 0 refills | Status: DC | PRN
Start: 2020-06-25 — End: 2022-01-07

## 2020-06-25 MED ORDER — ACETAMINOPHEN 500 MG PO TABS
1000.0000 mg | ORAL_TABLET | Freq: Three times a day (TID) | ORAL | 0 refills | Status: DC | PRN
Start: 2020-06-25 — End: 2022-01-07

## 2020-06-25 MED ORDER — CEPACOL SORE THROAT 5.4 MG MT LOZG
1.0000 | LOZENGE | OROMUCOSAL | 0 refills | Status: DC | PRN
Start: 2020-06-25 — End: 2022-01-07

## 2020-06-25 NOTE — ED Provider Notes (Signed)
Raven Roberts    CSN: [id number] Arrival date & time: 06/25/20  1912      History   Chief Complaint Chief Complaint  Patient presents with  . Sore Throat    HPI Raven Roberts is a 24 y.o. female.   Patient presents for sore throat and body ache and chills.  She also reports she has had some nasal drainage and congestion.  Denies cough, headache, nausea, vomiting, diarrhea.  She is unsure whether her friend had a true sore throat or was sore after having some vomiting.  Otherwise no sick contacts.  Patient states she has been able to eat and drink is just painful.  She has not tried any direct treatments.  Endorses Covid infection December 2020.     Past Medical History:  Diagnosis Date  . Asthma    no inhaler, no current problems  . Migraines    otc med prn  . Rectal pain, chronic   . S/P tubal ligation 09/08/2019    Patient Active Problem List   Diagnosis Date Noted  . S/P tubal ligation 09/08/2019  . SVD (spontaneous vaginal delivery) 08/04/2019  . Preeclampsia, third trimester 08/03/2019  . Admission for tubal ligation 07/25/2018  . Penicillin allergy 02/28/2015  . Rubella non-immune status, antepartum 02/28/2015  . Maternal morbid obesity, antepartum ([place])   . Migraine without aura and without status migrainosus, not intractable 12/11/2014    Past Surgical History:  Procedure Laterality Date  . DILATION AND CURETTAGE OF UTERUS    . DILATION AND EVACUATION N/A 04/18/2018   Procedure: DILATATION AND EVACUATION WITH ULTRASOUND GUIDANCE;  Surgeon: Janyth Contes, MD;  Location: [facility];  Service: Gynecology;  Laterality: N/A;  . LAPAROSCOPIC BILATERAL SALPINGECTOMY Bilateral 09/08/2019   Procedure: LAPAROSCOPIC BILATERAL SALPINGECTOMY;  Surgeon: Sherlyn Hay, DO;  Location: [facility];  Service: Gynecology;  Laterality: Bilateral;  . TOOTH EXTRACTION     w/anesthesia  . TUBAL LIGATION      OB History    Gravida  5   Para  4   Term    4   Preterm  0   AB  1   Living  4     SAB  1   TAB  0   Ectopic  0   Multiple  0   Live Births  4            Home Medications    Prior to Admission medications   Medication Sig Start Date End Date Taking? Authorizing Provider  acetaminophen (TYLENOL) 500 MG tablet Take 2 tablets (1,000 mg total) by mouth every 8 (eight) hours as needed. 06/25/20   Makynzi Eastland, Marguerita Beards, PA-C  ibuprofen (ADVIL) 600 MG tablet Take 1 tablet (600 mg total) by mouth every 6 (six) hours as needed. 06/25/20   Naydene Kamrowski, Marguerita Beards, PA-C  Menthol (CEPACOL SORE THROAT) 5.4 MG LOZG Use as directed 1 lozenge (5.4 mg total) in the mouth or throat every 2 (two) hours as needed. 06/25/20   Ahmeer Tuman, Marguerita Beards, PA-C    Family History Family History  Problem Relation Age of Onset  . Hypertension Mother   . Hypertension Father   . Diabetes Maternal Aunt   . Hypertension Maternal Aunt   . Diabetes Maternal Uncle   . Cancer Maternal Grandmother   . Hypertension Maternal Grandmother   . Cancer Maternal Grandfather        colon  . Hypertension Maternal Grandfather   . Stroke Maternal Grandfather  Social History Social History   Tobacco Use  . Smoking status: Former Smoker    Types: Cigars  . Smokeless tobacco: Never Used  . Tobacco comment: Black and mild 3 cigars/day  Vaping Use  . Vaping Use: Every day  . Substances: Nicotine, Flavoring  Substance Use Topics  . Alcohol use: Yes    Comment: occ  . Drug use: Yes    Types: Marijuana     Allergies   Pineapple, Amoxicillin, and Penicillins   Review of Systems Review of Systems   Physical Exam Triage Vital Signs ED Triage Vitals [06/25/20 2002]  Enc Vitals Group     BP (!) 145/74     Pulse Rate 96     Resp 18     Temp 100 F (37.8 C)     Temp Source Oral     SpO2      Weight 300 lb (136.1 kg)     Height 5\' 4"  (1.626 m)     Head Circumference      Peak Flow      Pain Score 10     Pain Loc      Pain Edu?      Excl. in [place]?    No data  found.  Updated Vital Signs BP (!) 145/74   Pulse 96   Temp 100 F (37.8 C) (Oral)   Resp 18   Ht 5\' 4"  (1.626 m)   Wt 300 lb (136.1 kg)   BMI 51.49 kg/m   Visual Acuity Right Eye Distance:   Left Eye Distance:   Bilateral Distance:    Right Eye Near:   Left Eye Near:    Bilateral Near:     Physical Exam Vitals and nursing note reviewed.  Constitutional:      General: She is not in acute distress.    Appearance: She is well-developed. She is not ill-appearing.  HENT:     Head: Normocephalic and atraumatic.     Right Ear: Tympanic membrane normal.     Left Ear: Tympanic membrane normal.     Nose: No congestion or rhinorrhea.     Mouth/Throat:     Mouth: Mucous membranes are moist.     Pharynx: Uvula midline. No pharyngeal swelling, oropharyngeal exudate, posterior oropharyngeal erythema or uvula swelling.     Tonsils: No tonsillar exudate or tonsillar abscesses. 2+ on the right. 2+ on the left.  Eyes:     Conjunctiva/sclera: Conjunctivae normal.  Cardiovascular:     Rate and Rhythm: Normal rate and regular rhythm.     Heart sounds: No murmur heard.   Pulmonary:     Effort: Pulmonary effort is normal. No respiratory distress.     Breath sounds: Normal breath sounds.  Abdominal:     Palpations: Abdomen is soft.     Tenderness: There is no abdominal tenderness.  Musculoskeletal:     Cervical back: Neck supple.  Lymphadenopathy:     Cervical: No cervical adenopathy.  Skin:    General: Skin is warm and dry.  Neurological:     Mental Status: She is alert.      UC Treatments / Results  Labs (all labs ordered are listed, but only abnormal results are displayed) Labs Reviewed  SARS CORONAVIRUS 2 (TAT 6-24 HRS)  CULTURE, GROUP A STREP [facility])  POCT RAPID STREP A, ED / UC    EKG   Radiology No results found.  Procedures Procedures (including critical care time)  Medications Ordered in UC Medications -  No data to display  Initial Impression /  Assessment and Plan / UC Course  I have reviewed the triage vital signs and the nursing notes.  Pertinent labs & imaging results that were available during my care of the patient were reviewed by me and considered in my medical decision making (see chart for details).     #Viral pharyngitis Patient is 24 year old presenting with suspected viral pharyngitis.  Rapid strep is negative, culture sent.  Will defer antibiotics until culture returns.  Symptomatic care.  Covid sent.  Discussed return and follow-up precautions.  Patient verbalized understanding plan of care Final Clinical Impressions(s) / UC Diagnoses   Final diagnoses:  Viral pharyngitis     Discharge Instructions     The strep test was negative, we have a sent a culture and will call if we need to change treatments  Use cepacol every 2 hours  Take tylenol as prescribed every 8 hours Take ibuprofen for additional relief  If not improving and culture is negative, over the next 2-3 days return for evaluation  If your Covid-19 test is positive, you will receive a phone call from [facility] regarding your results. Negative test results are not called. Both positive and negative results area always visible on MyChart. If you do not have a MyChart account, sign up instructions are in your discharge papers.   Persons who are directed to care for themselves at home may discontinue isolation under the following conditions:  . At least 10 days have passed since symptom onset and . At least 24 hours have passed without running a fever (this means without the use of fever-reducing medications) and . Other symptoms have improved.  Persons infected with COVID-19 who never develop symptoms may discontinue isolation and other precautions 10 days after the date of their first positive COVID-19 test.       ED Prescriptions    Medication Sig Dispense Auth. Provider   Menthol (CEPACOL SORE THROAT) 5.4 MG LOZG Use as directed 1  lozenge (5.4 mg total) in the mouth or throat every 2 (two) hours as needed. 30 lozenge Angelino Rumery, Marguerita Beards, PA-C   ibuprofen (ADVIL) 600 MG tablet Take 1 tablet (600 mg total) by mouth every 6 (six) hours as needed. 30 tablet Jaslyn Bansal, Marguerita Beards, PA-C   acetaminophen (TYLENOL) 500 MG tablet Take 2 tablets (1,000 mg total) by mouth every 8 (eight) hours as needed. 30 tablet Emary Zalar, Marguerita Beards, PA-C     PDMP not reviewed this encounter.   Purnell Shoemaker, PA-C 06/25/20 2047

## 2020-06-25 NOTE — Discharge Instructions (Signed)
The strep test was negative, we have a sent a culture and will call if we need to change treatments  Use cepacol every 2 hours  Take tylenol as prescribed every 8 hours Take ibuprofen for additional relief  If not improving and culture is negative, over the next 2-3 days return for evaluation  If your Covid-19 test is positive, you will receive a phone call from [facility] regarding your results. Negative test results are not called. Both positive and negative results area always visible on MyChart. If you do not have a MyChart account, sign up instructions are in your discharge papers.   Persons who are directed to care for themselves at home may discontinue isolation under the following conditions:   At least 10 days have passed since symptom onset and  At least 24 hours have passed without running a fever (this means without the use of fever-reducing medications) and  Other symptoms have improved.  Persons infected with COVID-19 who never develop symptoms may discontinue isolation and other precautions 10 days after the date of their first positive COVID-19 test.

## 2020-06-25 NOTE — ED Triage Notes (Signed)
Pt c/o sore throat, dysphagia, body aches and chillsx2 days.

## 2020-06-26 LAB — SARS CORONAVIRUS 2 (TAT 6-24 HRS): SARS Coronavirus 2: NEGATIVE

## 2020-06-28 LAB — CULTURE, GROUP A STREP (THRC)

## 2021-02-07 IMAGING — US US EXTREM  UP VENOUS*L*
1 series · 13 of 24 positions shown · non-contrast
Comparison: None.

CLINICAL DATA: Pain in the LEFT UPPER extremity for 4-5 days.
Status post surgery with IV catheter in the same hand.



[Series 1: us extrem up venous*left* · 13 of 29 slices shown]
[im 1/29]
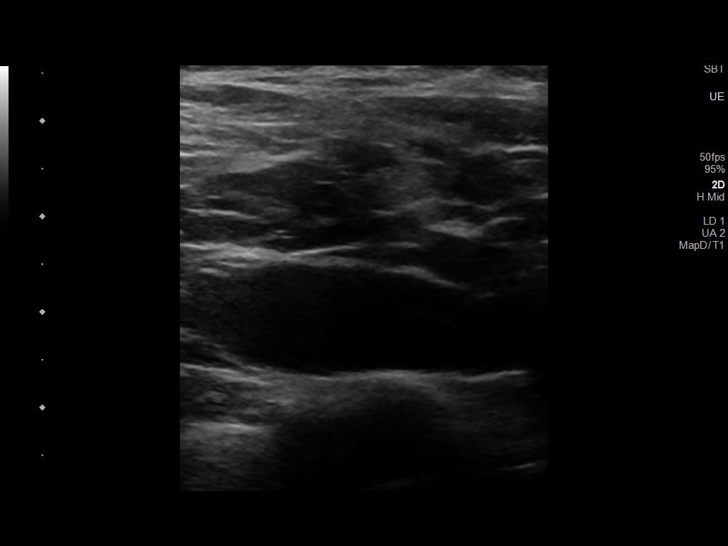
[im 3/29]
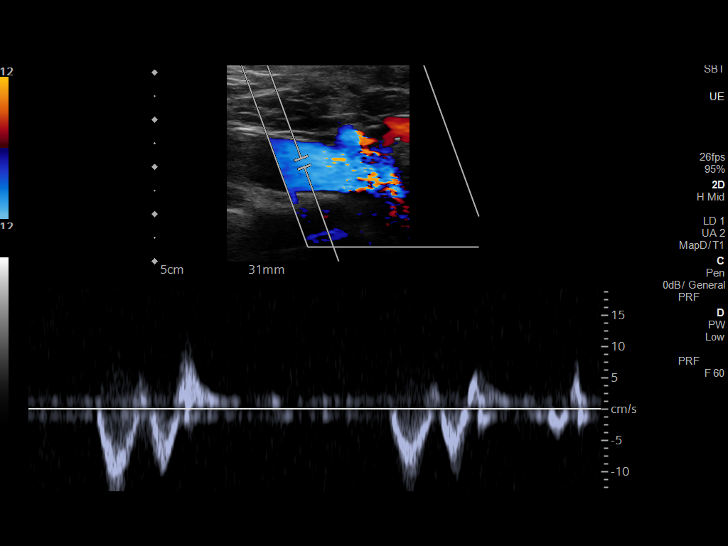
[im 5/29]
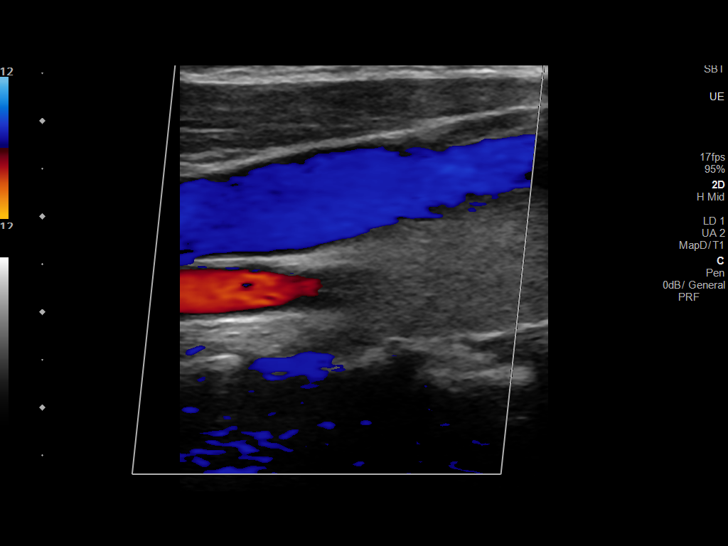
[im 8/29]
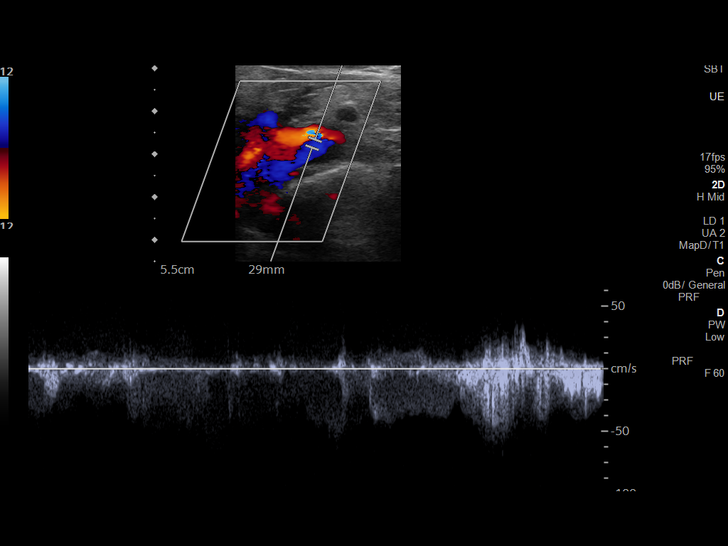
[im 10/29]
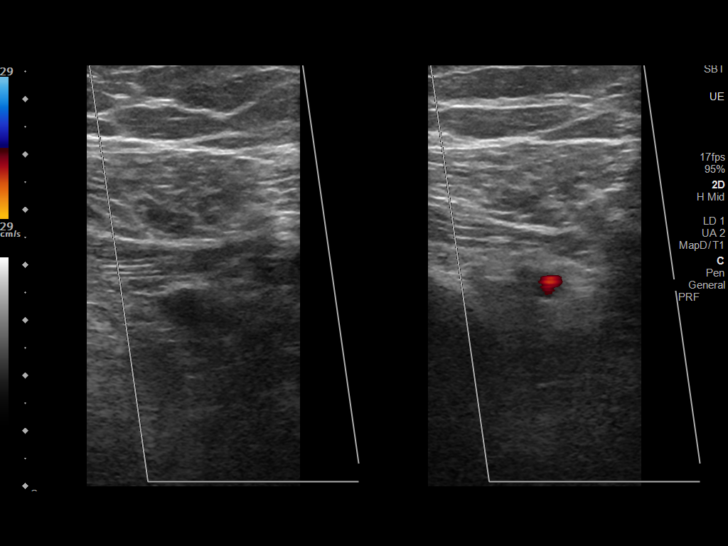
[im 13/29]
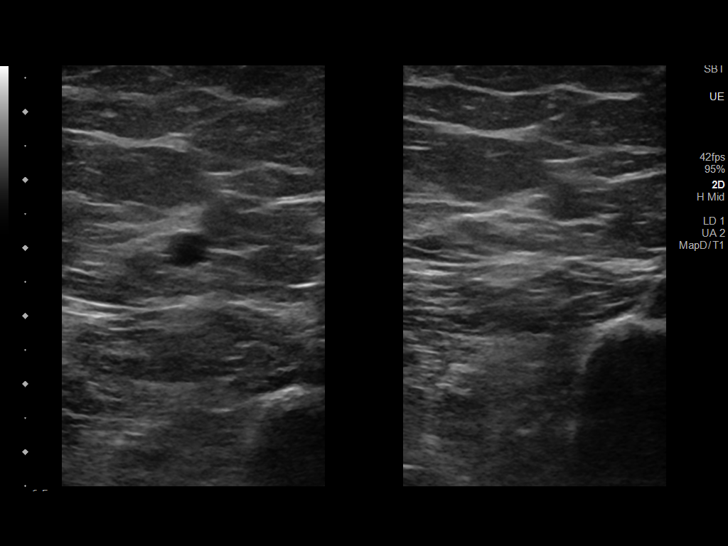
[im 15/29]
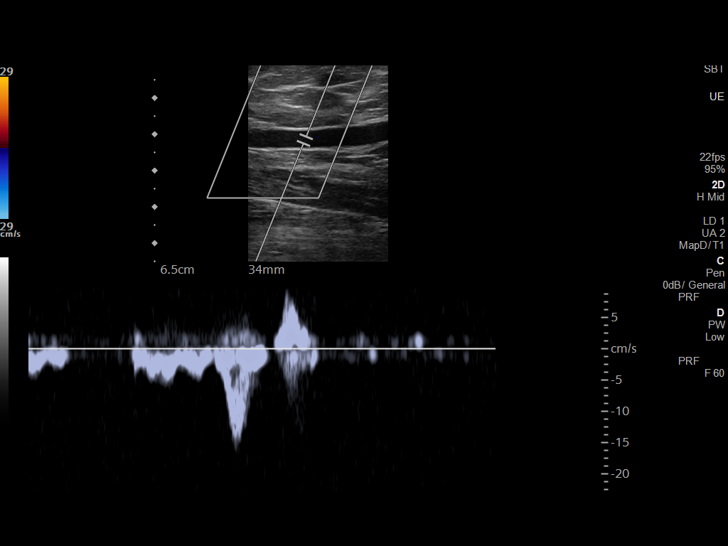
[im 16/29]
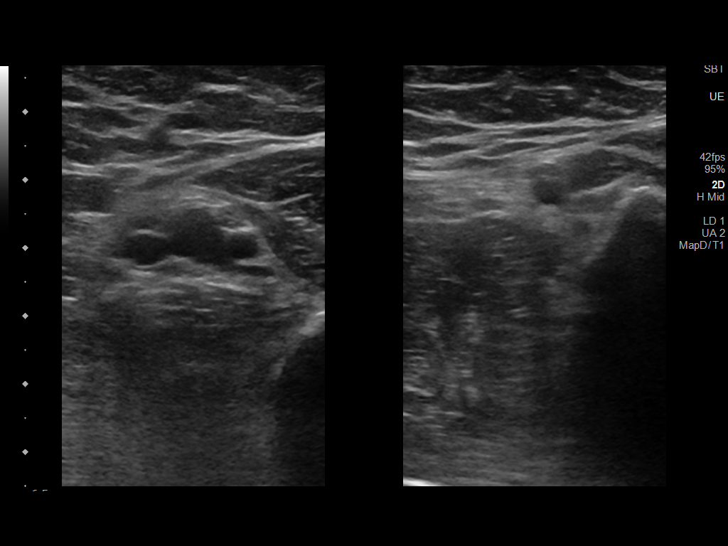
[im 19/29]
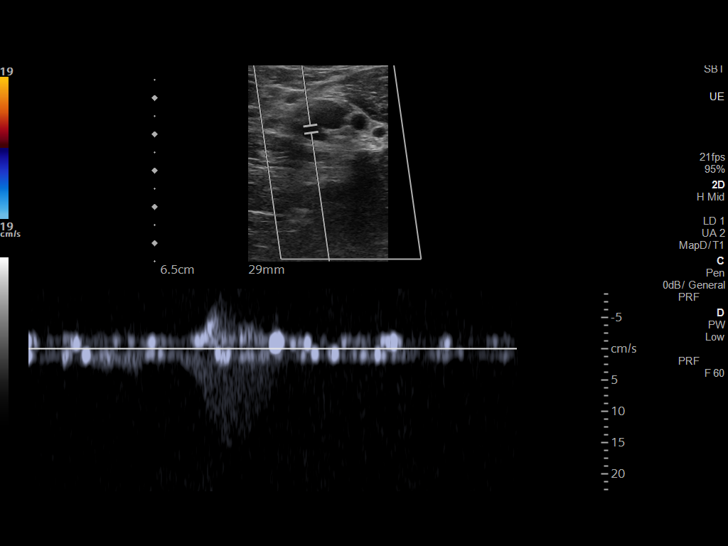
[im 21/29]
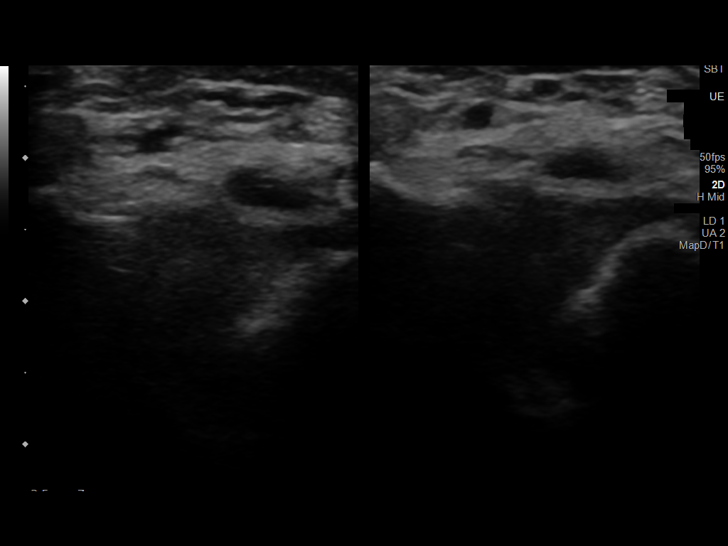
[im 24/29]
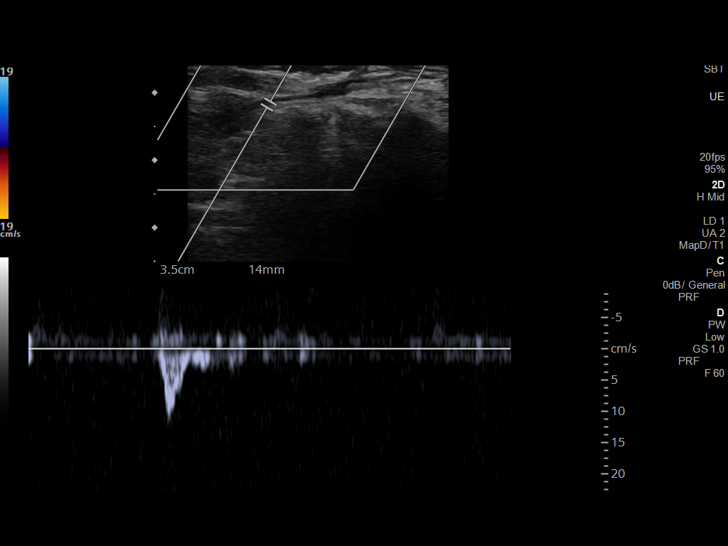
[im 26/29]
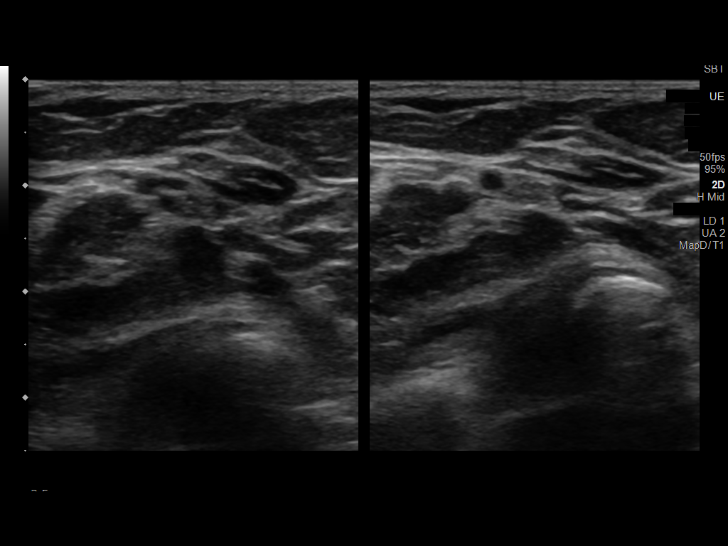
[im 29/29]
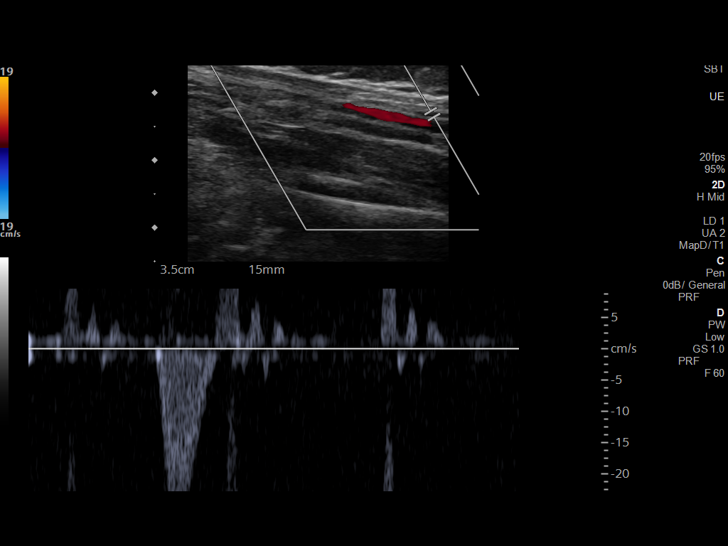

[13 of 24 positions shown; findings below may reference images not displayed]

FINDINGS: Contralateral Subclavian Vein: Respiratory phasicity is normal and
symmetric with the symptomatic side. No evidence of thrombus. Normal
compressibility.

Internal Jugular Vein: No evidence of thrombus. Normal
compressibility, respiratory phasicity and response to augmentation.

Subclavian Vein: No evidence of thrombus. Normal compressibility,
respiratory phasicity and response to augmentation.

Axillary Vein: No evidence of thrombus. Normal compressibility,
respiratory phasicity and response to augmentation.

Cephalic Vein: No evidence of thrombus. Normal compressibility,
respiratory phasicity and response to augmentation.

Basilic Vein: No evidence of thrombus. Normal compressibility,
respiratory phasicity and response to augmentation.

Brachial Veins: No evidence of thrombus. Normal compressibility,
respiratory phasicity and response to augmentation.

Radial Veins: No evidence of thrombus. Normal compressibility,
respiratory phasicity and response to augmentation.

Ulnar Veins: No evidence of thrombus. Normal compressibility,
respiratory phasicity and response to augmentation.

Venous Reflux:  None visualized.

Other Findings:  None visualized.
IMPRESSION: No evidence of DVT within the left upper extremity.

## 2022-01-07 ENCOUNTER — Other Ambulatory Visit: Payer: Self-pay

## 2022-01-07 ENCOUNTER — Ambulatory Visit (INDEPENDENT_AMBULATORY_CARE_PROVIDER_SITE_OTHER): Payer: Medicaid Other | Admitting: Nurse Practitioner

## 2022-01-07 ENCOUNTER — Encounter: Payer: Self-pay | Admitting: Nurse Practitioner

## 2022-01-07 VITALS — BP 140/96 | HR 62 | Temp 98.1°F | Resp 12 | Ht 64.5 in | Wt 325.0 lb

## 2022-01-07 DIAGNOSIS — Z6841 Body Mass Index (BMI) 40.0 and over, adult: Secondary | ICD-10-CM | POA: Diagnosis not present

## 2022-01-07 DIAGNOSIS — R030 Elevated blood-pressure reading, without diagnosis of hypertension: Secondary | ICD-10-CM | POA: Diagnosis not present

## 2022-01-07 NOTE — Patient Instructions (Addendum)
1. Class 3 severe obesity due to excess calories without serious comorbidity with body mass index (BMI) of 50.0 to 59.9 in adult (HCC)  - Amb Ref to Medical Weight Management - CBC; Future - Comprehensive metabolic panel; Future - TSH - Lipid Panel  Low calorie diet  Stay active - start walking routine  Will check labs:  Orders Placed This Encounter  Procedures   CBC    Standing Status:   Future    Standing Expiration Date:   01/07/2023   Comprehensive metabolic panel    Standing Status:   Future    Standing Expiration Date:   01/07/2023   TSH   Lipid Panel   Amb Ref to Medical Weight Management    Referral Priority:   Routine    Referral Type:   Consultation    Number of Visits Requested:   1      2. Elevated BP without diagnosis of hypertension  Low salt diet  Stay active  Stay well hydrated     Follow up:  Follow up in 2-4 weeks for blood pressure recheck with Amy or Dr. Redmond Pulling  Calorie Counting for Weight Loss Calories are units of energy. Your body needs a certain number of calories from food to keep going throughout the day. When you eat or drink more calories than your body needs, your body stores the extra calories mostly as fat. When you eat or drink fewer calories than your body needs, your body burns fat to get the energy it needs. Calorie counting means keeping track of how many calories you eat and drink each day. Calorie counting can be helpful if you need to lose weight. If you eat fewer calories than your body needs, you should lose weight. Ask your health care provider what a healthy weight is for you. For calorie counting to work, you will need to eat the right number of calories each day to lose a healthy amount of weight per week. A dietitian can help you figure out how many calories you need in a day and will suggest ways to reach your calorie goal. A healthy amount of weight to lose each week is usually 1-2 lb (0.5-0.9 kg). This usually means  that your daily calorie intake should be reduced by 500-750 calories. Eating 1,200-1,500 calories a day can help most women lose weight. Eating 1,500-1,800 calories a day can help most men lose weight. What do I need to know about calorie counting? Work with your health care provider or dietitian to determine how many calories you should get each day. To meet your daily calorie goal, you will need to: Find out how many calories are in each food that you would like to eat. Try to do this before you eat. Decide how much of the food you plan to eat. Keep a food log. Do this by writing down what you ate and how many calories it had. To successfully lose weight, it is important to balance calorie counting with a healthy lifestyle that includes regular activity. Where do I find calorie information? The number of calories in a food can be found on a Nutrition Facts label. If a food does not have a Nutrition Facts label, try to look up the calories online or ask your dietitian for help. Remember that calories are listed per serving. If you choose to have more than one serving of a food, you will have to multiply the calories per serving by the number of servings you plan  to eat. For example, the label on a package of bread might say that a serving size is 1 slice and that there are 90 calories in a serving. If you eat 1 slice, you will have eaten 90 calories. If you eat 2 slices, you will have eaten 180 calories. How do I keep a food log? After each time that you eat, record the following in your food log as soon as possible: What you ate. Be sure to include toppings, sauces, and other extras on the food. How much you ate. This can be measured in cups, ounces, or number of items. How many calories were in each food and drink. The total number of calories in the food you ate. Keep your food log near you, such as in a pocket-sized notebook or on an app or website on your mobile phone. Some programs will  calculate calories for you and show you how many calories you have left to meet your daily goal. What are some portion-control tips? Know how many calories are in a serving. This will help you know how many servings you can have of a certain food. Use a measuring cup to measure serving sizes. You could also try weighing out portions on a kitchen scale. With time, you will be able to estimate serving sizes for some foods. Take time to put servings of different foods on your favorite plates or in your favorite bowls and cups so you know what a serving looks like. Try not to eat straight from a food's packaging, such as from a bag or box. Eating straight from the package makes it hard to see how much you are eating and can lead to overeating. Put the amount you would like to eat in a cup or on a plate to make sure you are eating the right portion. Use smaller plates, glasses, and bowls for smaller portions and to prevent overeating. Try not to multitask. For example, avoid watching TV or using your computer while eating. If it is time to eat, sit down at a table and enjoy your food. This will help you recognize when you are full. It will also help you be more mindful of what and how much you are eating. What are tips for following this plan? Reading food labels Check the calorie count compared with the serving size. The serving size may be smaller than what you are used to eating. Check the source of the calories. Try to choose foods that are high in protein, fiber, and vitamins, and low in saturated fat, trans fat, and sodium. Shopping Read nutrition labels while you shop. This will help you make healthy decisions about which foods to buy. Pay attention to nutrition labels for low-fat or fat-free foods. These foods sometimes have the same number of calories or more calories than the full-fat versions. They also often have added sugar, starch, or salt to make up for flavor that was removed with the  fat. Make a grocery list of lower-calorie foods and stick to it. Cooking Try to cook your favorite foods in a healthier way. For example, try baking instead of frying. Use low-fat dairy products. Meal planning Use more fruits and vegetables. One-half of your plate should be fruits and vegetables. Include lean proteins, such as chicken, [place], and fish. Lifestyle Each week, aim to do one of the following: 150 minutes of moderate exercise, such as walking. 75 minutes of vigorous exercise, such as running. General information Know how many calories are in  the foods you eat most often. This will help you calculate calorie counts faster. Find a way of tracking calories that works for you. Get creative. Try different apps or programs if writing down calories does not work for you. What foods should I eat?  Eat nutritious foods. It is better to have a nutritious, high-calorie food, such as an avocado, than a food with few nutrients, such as a bag of potato chips. Use your calories on foods and drinks that will fill you up and will not leave you hungry soon after eating. Examples of foods that fill you up are nuts and nut butters, vegetables, lean proteins, and high-fiber foods such as whole grains. High-fiber foods are foods with more than 5 g of fiber per serving. Pay attention to calories in drinks. Low-calorie drinks include water and unsweetened drinks. The items listed above may not be a complete list of foods and beverages you can eat. Contact a dietitian for more information. What foods should I limit? Limit foods or drinks that are not good sources of vitamins, minerals, or protein or that are high in unhealthy fats. These include: Candy. Other sweets. Sodas, specialty coffee drinks, alcohol, and juice. The items listed above may not be a complete list of foods and beverages you should avoid. Contact a dietitian for more information. How do I count calories when eating out? Pay  attention to portions. Often, portions are much larger when eating out. Try these tips to keep portions smaller: Consider sharing a meal instead of getting your own. If you get your own meal, eat only half of it. Before you start eating, ask for a container and put half of your meal into it. When available, consider ordering smaller portions from the menu instead of full portions. Pay attention to your food and drink choices. Knowing the way food is cooked and what is included with the meal can help you eat fewer calories. If calories are listed on the menu, choose the lower-calorie options. Choose dishes that include vegetables, fruits, whole grains, low-fat dairy products, and lean proteins. Choose items that are boiled, broiled, grilled, or steamed. Avoid items that are buttered, battered, fried, or served with cream sauce. Items labeled as crispy are usually fried, unless stated otherwise. Choose water, low-fat milk, unsweetened iced tea, or other drinks without added sugar. If you want an alcoholic beverage, choose a lower-calorie option, such as a glass of wine or light beer. Ask for dressings, sauces, and syrups on the side. These are usually high in calories, so you should limit the amount you eat. If you want a salad, choose a garden salad and ask for grilled meats. Avoid extra toppings such as bacon, cheese, or fried items. Ask for the dressing on the side, or ask for olive oil and vinegar or lemon to use as dressing. Estimate how many servings of a food you are given. Knowing serving sizes will help you be aware of how much food you are eating at restaurants. Where to find more information Centers for Disease Control and Prevention: http://www.wolf.info/ [place]. Department of Agriculture: http://www.wilson-mendoza.org/ Summary Calorie counting means keeping track of how many calories you eat and drink each day. If you eat fewer calories than your body needs, you should lose weight. A healthy amount of weight to lose per  week is usually 1-2 lb (0.5-0.9 kg). This usually means reducing your daily calorie intake by 500-750 calories. The number of calories in a food can be found on a Nutrition  Facts label. If a food does not have a Nutrition Facts label, try to look up the calories online or ask your dietitian for help. Use smaller plates, glasses, and bowls for smaller portions and to prevent overeating. Use your calories on foods and drinks that will fill you up and not leave you hungry shortly after a meal. This information is not intended to replace advice given to you by your health care provider. Make sure you discuss any questions you have with your health care provider. Document Revised: 12/07/2019 Document Reviewed: 12/07/2019 Elsevier Patient Education  2022 [place].   Blood Pressure Record Sheet To take your blood pressure, you will need a blood pressure machine. You can buy a blood pressure machine (blood pressure monitor) at your clinic, drug store, or online. When choosing one, consider: An automatic monitor that has an arm cuff. A cuff that wraps snugly around your upper arm. You should be able to fit only one finger between your arm and the cuff. A device that stores blood pressure reading results. Do not choose a monitor that measures your blood pressure from your wrist or finger. Follow your health care provider's instructions for how to take your blood pressure. To use this form: Get one reading in the morning (a.m.) before you take any medicines. Get one reading in the evening (p.m.) before supper. Take at least 2 readings with each blood pressure check. This makes sure the results are correct. Wait 1-2 minutes between measurements. Write down the results in the spaces on this form. Repeat this once a week, or as told by your health care provider. Make a follow-up appointment with your health care provider to discuss the results. Blood pressure log Date: _______________________ a.m.  _____________________(1st reading) _____________________(2nd reading) p.m. _____________________(1st reading) _____________________(2nd reading) Date: _______________________ a.m. _____________________(1st reading) _____________________(2nd reading) p.m. _____________________(1st reading) _____________________(2nd reading) Date: _______________________ a.m. _____________________(1st reading) _____________________(2nd reading) p.m. _____________________(1st reading) _____________________(2nd reading) Date: _______________________ a.m. _____________________(1st reading) _____________________(2nd reading) p.m. _____________________(1st reading) _____________________(2nd reading) Date: _______________________ a.m. _____________________(1st reading) _____________________(2nd reading) p.m. _____________________(1st reading) _____________________(2nd reading) This information is not intended to replace advice given to you by your health care provider. Make sure you discuss any questions you have with your health care provider. Document Revised: 02/13/2020 Document Reviewed: 02/14/2020 Elsevier Patient Education  2022 [place] Your Hypertension Hypertension, also called high blood pressure, is when the force of the blood pressing against the walls of the arteries is too strong. Arteries are blood vessels that carry blood from your heart throughout your body. Hypertension forces the heart to work harder to pump blood and may cause the arteries to become narrow or stiff. Understanding blood pressure readings Your personal target blood pressure may vary depending on your medical conditions, your age, and other factors. A blood pressure reading includes a higher number over a lower number. Ideally, your blood pressure should be below 120/80. You should know that: The first, or top, number is called the systolic pressure. It is a measure of the pressure in your arteries as your heart  beats. The second, or bottom number, is called the diastolic pressure. It is a measure of the pressure in your arteries as the heart relaxes. Blood pressure is classified into four stages. Based on your blood pressure reading, your health care provider may use the following stages to determine what type of treatment you need, if any. Systolic pressure and diastolic pressure are measured in a unit called mmHg. Normal Systolic pressure: below  376. Diastolic pressure: below 80. Elevated Systolic pressure: 283-151. Diastolic pressure: below 80. Hypertension stage 1 Systolic pressure: 761-607. Diastolic pressure: 37-10. Hypertension stage 2 Systolic pressure: 626 or above. Diastolic pressure: 90 or above. How can this condition affect me? Managing your hypertension is an important responsibility. Over time, hypertension can damage the arteries and decrease blood flow to important parts of the body, including the brain, heart, and kidneys. Having untreated or uncontrolled hypertension can lead to: A heart attack. A stroke. A weakened blood vessel (aneurysm). Heart failure. Kidney damage. Eye damage. Metabolic syndrome. Memory and concentration problems. Vascular dementia. What actions can I take to manage this condition? Hypertension can be managed by making lifestyle changes and possibly by taking medicines. Your health care provider will help you make a plan to bring your blood pressure within a normal range. Nutrition  Eat a diet that is high in fiber and potassium, and low in salt (sodium), added sugar, and fat. An example eating plan is called the Dietary Approaches to Stop Hypertension (DASH) diet. To eat this way: Eat plenty of fresh fruits and vegetables. Try to fill one-half of your plate at each meal with fruits and vegetables. Eat whole grains, such as whole-wheat pasta, brown rice, or whole-grain bread. Fill about one-fourth of your plate with whole grains. Eat low-fat dairy  products. Avoid fatty cuts of meat, processed or cured meats, and poultry with skin. Fill about one-fourth of your plate with lean proteins such as fish, chicken without skin, beans, eggs, and tofu. Avoid pre-made and processed foods. These tend to be higher in sodium, added sugar, and fat. Reduce your daily sodium intake. Most people with hypertension should eat less than 1,500 mg of sodium a day. Lifestyle  Work with your health care provider to maintain a healthy body weight or to lose weight. Ask what an ideal weight is for you. Get at least 30 minutes of exercise that causes your heart to beat faster (aerobic exercise) most days of the week. Activities may include walking, swimming, or biking. Include exercise to strengthen your muscles (resistance exercise), such as weight lifting, as part of your weekly exercise routine. Try to do these types of exercises for 30 minutes at least 3 days a week. Do not use any products that contain nicotine or tobacco, such as cigarettes, e-cigarettes, and chewing tobacco. If you need help quitting, ask your health care provider. Control any long-term (chronic) conditions you have, such as high cholesterol or diabetes. Identify your sources of stress and find ways to manage stress. This may include meditation, deep breathing, or making time for fun activities. Alcohol use Do not drink alcohol if: Your health care provider tells you not to drink. You are pregnant, may be pregnant, or are planning to become pregnant. If you drink alcohol: Limit how much you use to: 0-1 drink a day for women. 0-2 drinks a day for men. Be aware of how much alcohol is in your drink. In the [place] one drink equals one 12 oz bottle of beer (355 mL), one 5 oz glass of wine (148 mL), or one 1 oz glass of hard liquor (44 mL). Medicines Your health care provider may prescribe medicine if lifestyle changes are not enough to get your blood pressure under control and if: Your systolic  blood pressure is 130 or higher. Your diastolic blood pressure is 80 or higher. Take medicines only as told by your health care provider. Follow the directions carefully. Blood pressure medicines must be  taken as told by your health care provider. The medicine does not work as well when you skip doses. Skipping doses also puts you at risk for problems. Monitoring Before you monitor your blood pressure: Do not smoke, drink caffeinated beverages, or exercise within 30 minutes before taking a measurement. Use the bathroom and empty your bladder (urinate). Sit quietly for at least 5 minutes before taking measurements. Monitor your blood pressure at home as told by your health care provider. To do this: Sit with your back straight and supported. Place your feet flat on the floor. Do not cross your legs. Support your arm on a flat surface, such as a table. Make sure your upper arm is at heart level. Each time you measure, take two or three readings one minute apart and record the results. You may also need to have your blood pressure checked regularly by your health care provider. General information Talk with your health care provider about your diet, exercise habits, and other lifestyle factors that may be contributing to hypertension. Review all the medicines you take with your health care provider because there may be side effects or interactions. Keep all visits as told by your health care provider. Your health care provider can help you create and adjust your plan for managing your high blood pressure. Where to find more information National Heart, Lung, and Blood Institute: https://wilson-eaton.com/ American Heart Association: www.heart.org Contact a health care provider if: You think you are having a reaction to medicines you have taken. You have repeated (recurrent) headaches. You feel dizzy. You have swelling in your ankles. You have trouble with your vision. Get help right away if: You  develop a severe headache or confusion. You have unusual weakness or numbness, or you feel faint. You have severe pain in your chest or abdomen. You vomit repeatedly. You have trouble breathing. These symptoms may represent a serious problem that is an emergency. Do not wait to see if the symptoms will go away. Get medical help right away. Call your local emergency services (911 in the [place] Do not drive yourself to the hospital. Summary Hypertension is when the force of blood pumping through your arteries is too strong. If this condition is not controlled, it may put you at risk for serious complications. Your personal target blood pressure may vary depending on your medical conditions, your age, and other factors. For most people, a normal blood pressure is less than 120/80. Hypertension is managed by lifestyle changes, medicines, or both. Lifestyle changes to help manage hypertension include losing weight, eating a healthy, low-sodium diet, exercising more, stopping smoking, and limiting alcohol. This information is not intended to replace advice given to you by your health care provider. Make sure you discuss any questions you have with your health care provider. Document Revised: 11/13/2019 Document Reviewed: 09/26/2019 Elsevier Patient Education  2022 [employer].

## 2022-01-07 NOTE — Progress Notes (Signed)
Weight concerns ?Request medication - Ozempic ?

## 2022-01-07 NOTE — Assessment & Plan Note (Signed)
Class 3 severe obesity due to excess calories without serious comorbidity with body mass index (BMI) of 50.0 to 59.9 in adult [facility]) ? ?- Amb Ref to Medical Weight Management ?- CBC; Future ?- Comprehensive metabolic panel; Future ?- TSH ?- Lipid Panel ? ?Low calorie diet ? ?Stay active - start walking routine ? ?Will check labs: ? ?Orders Placed This Encounter  ?Procedures  ?? CBC  ?  Standing Status:   Future  ?  Standing Expiration Date:   01/07/2023  ?? Comprehensive metabolic panel  ?  Standing Status:   Future  ?  Standing Expiration Date:   01/07/2023  ?? TSH  ?? Lipid Panel  ?? Amb Ref to Medical Weight Management  ?  Referral Priority:   Routine  ?  Referral Type:   Consultation  ?  Number of Visits Requested:   1  ? ? ? ? ?2. Elevated BP without diagnosis of hypertension ? ?Low salt diet ? ?Stay active ? ?Stay well hydrated ? ? ? ? ?Follow up: ? ?Follow up in 2-4 weeks for blood pressure recheck with Amy or Dr. Redmond Pulling ?

## 2022-01-07 NOTE — Progress Notes (Signed)
$'@Patient'B$  ID: Ruthe Mannan, female    DOB: 1996/05/22, 26 y.o.   MRN: [id number] ? ?Chief Complaint  ?Patient presents with  ? Weight Loss  ? ? ?Referring provider: ?No ref. provider found ? ?HPI ? ?Patient presents today to establish care.  Patient is not currently on any prescription medications.  She states that she does not have any significant health history.  She is concerned about trying to lose weight.  Patient's BMI is currently over 50.  We discussed that we can refer her to medical weight management.  Patient is trying to watch what she eats but she does not currently exercise. Denies f/c/s, n/v/d, hemoptysis, PND, chest pain or edema. ? ?Note: Patient's blood pressure was borderline today - 140/96 - discussed lifestyle modifications ? ?Allergies  ?Allergen Reactions  ? Pineapple Anaphylaxis, Shortness Of Breath and Swelling  ? Amoxicillin Rash and Other (See Comments)  ?  Has patient had a PCN reaction causing immediate rash, facial/tongue/throat swelling, SOB or lightheadedness with hypotension: NO . Patient states rash as a child ?Has patient had a PCN reaction causing severe rash involving mucus membranes or skin necrosis: No ?Has patient had a PCN reaction that required hospitalization No ?Has patient had a PCN reaction occurring within the last 10 years: No ?If all of the above answers are "NO", then may proceed with Cephalosporin use.  ? Penicillins Rash and Other (See Comments)  ?  Has patient had a PCN reaction causing immediate rash, facial/tongue/throat swelling, SOB or lightheadedness with hypotension: NO.  Patient states rash as a child ?Has patient had a PCN reaction causing severe rash involving mucus membranes or skin necrosis: No ?Has patient had a PCN reaction that required hospitalization No ?Has patient had a PCN reaction occurring within the last 10 years: No ?If all of the above answers are "NO", then may proceed with Cephalosporin use.  ? ? ?Immunization History  ?Administered  Date(s) Administered  ? Influenza,inj,Quad PF,6+ Mos 09/10/2016, 08/05/2019  ? MMR 03/02/2015  ? Tdap 03/01/2015, 09/10/2016  ? ? ?Past Medical History:  ?Diagnosis Date  ? Asthma   ? no inhaler, no current problems  ? Migraines   ? otc med prn  ? Rectal pain, chronic   ? S/P tubal ligation 09/08/2019  ? ? ?Tobacco History: ?Social History  ? ?Tobacco Use  ?Smoking Status Former  ? Types: Cigars  ?Smokeless Tobacco Never  ?Tobacco Comments  ? Black and mild 3 cigars/day  ? ?Counseling given: Not Answered ?Tobacco comments: Black and mild 3 cigars/day ? ? ?Outpatient Encounter Medications as of 01/07/2022  ?Medication Sig  ? [DISCONTINUED] acetaminophen (TYLENOL) 500 MG tablet Take 2 tablets (1,000 mg total) by mouth every 8 (eight) hours as needed. (Patient not taking: Reported on 01/07/2022)  ? [DISCONTINUED] ibuprofen (ADVIL) 600 MG tablet Take 1 tablet (600 mg total) by mouth every 6 (six) hours as needed. (Patient not taking: Reported on 01/07/2022)  ? [DISCONTINUED] Menthol (CEPACOL SORE THROAT) 5.4 MG LOZG Use as directed 1 lozenge (5.4 mg total) in the mouth or throat every 2 (two) hours as needed. (Patient not taking: Reported on 01/07/2022)  ? ?No facility-administered encounter medications on file as of 01/07/2022.  ? ? ? ?Review of Systems ? ?Review of Systems  ?Constitutional: Negative.   ?HENT: Negative.    ?Cardiovascular: Negative.   ?Gastrointestinal: Negative.   ?Allergic/Immunologic: Negative.   ?Neurological: Negative.   ?Psychiatric/Behavioral: Negative.     ? ? ? ?Physical Exam ? ?BP Marland Kitchen)  140/96 (BP Location: Right Arm, Cuff Size: Normal)   Pulse 62   Temp 98.1 ?F (36.7 ?C) (Oral)   Resp 12   Ht 5' 4.5" (1.638 m)   Wt (!) 325 lb (147.4 kg)   LMP 12/31/2021 (Exact Date)   SpO2 99%   BMI 54.92 kg/m?  ? ?Wt Readings from Last 5 Encounters:  ?01/07/22 (!) 325 lb (147.4 kg)  ?06/25/20 300 lb (136.1 kg)  ?09/14/19 (!) 303 lb (137.4 kg)  ?09/12/19 300 lb (136.1 kg)  ?09/08/19 (!) 300 lb 0.7 oz (136.1 kg)   ? ? ? ?Physical Exam ?Vitals and nursing note reviewed.  ?Constitutional:   ?   General: She is not in acute distress. ?   Appearance: She is well-developed.  ?Cardiovascular:  ?   Rate and Rhythm: Normal rate and regular rhythm.  ?Pulmonary:  ?   Effort: Pulmonary effort is normal.  ?   Breath sounds: Normal breath sounds.  ?Neurological:  ?   Mental Status: She is alert and oriented to person, place, and time.  ? ? ? ? ?Assessment & Plan:  ? ?Class 3 severe obesity due to excess calories without serious comorbidity with body mass index (BMI) of 50.0 to 59.9 in adult [facility]) ?Class 3 severe obesity due to excess calories without serious comorbidity with body mass index (BMI) of 50.0 to 59.9 in adult [facility]) ? ?- Amb Ref to Medical Weight Management ?- CBC; Future ?- Comprehensive metabolic panel; Future ?- TSH ?- Lipid Panel ? ?Low calorie diet ? ?Stay active - start walking routine ? ?Will check labs: ? ?Orders Placed This Encounter  ?Procedures  ? CBC  ?  Standing Status:   Future  ?  Standing Expiration Date:   01/07/2023  ? Comprehensive metabolic panel  ?  Standing Status:   Future  ?  Standing Expiration Date:   01/07/2023  ? TSH  ? Lipid Panel  ? Amb Ref to Medical Weight Management  ?  Referral Priority:   Routine  ?  Referral Type:   Consultation  ?  Number of Visits Requested:   1  ? ? ? ? ?2. Elevated BP without diagnosis of hypertension ? ?Low salt diet ? ?Stay active ? ?Stay well hydrated ? ? ? ? ?Follow up: ? ?Follow up in 2-4 weeks for blood pressure recheck with Amy or Dr. Redmond Pulling ? ? ? ? ?Fenton Foy, NP ?01/07/2022 ? ?

## 2022-02-05 ENCOUNTER — Ambulatory Visit: Payer: Medicaid Other | Admitting: Family Medicine

## 2022-02-18 ENCOUNTER — Ambulatory Visit: Payer: Medicaid Other | Admitting: Family Medicine

## 2022-08-12 ENCOUNTER — Encounter (INDEPENDENT_AMBULATORY_CARE_PROVIDER_SITE_OTHER): Payer: Medicaid Other | Admitting: Family Medicine

## 2022-11-16 ENCOUNTER — Other Ambulatory Visit: Payer: Self-pay

## 2022-11-16 ENCOUNTER — Encounter (HOSPITAL_BASED_OUTPATIENT_CLINIC_OR_DEPARTMENT_OTHER): Payer: Self-pay | Admitting: Emergency Medicine

## 2022-11-16 ENCOUNTER — Emergency Department (HOSPITAL_BASED_OUTPATIENT_CLINIC_OR_DEPARTMENT_OTHER)
Admission: EM | Admit: 2022-11-16 | Discharge: 2022-11-16 | Disposition: A | Discharge status: Home / Self Care | Payer: Medicaid Other | Attending: Emergency Medicine | Admitting: Emergency Medicine

## 2022-11-16 DIAGNOSIS — U071 COVID-19: Secondary | ICD-10-CM | POA: Diagnosis not present

## 2022-11-16 DIAGNOSIS — G44209 Tension-type headache, unspecified, not intractable: Secondary | ICD-10-CM | POA: Insufficient documentation

## 2022-11-16 DIAGNOSIS — R519 Headache, unspecified: Secondary | ICD-10-CM | POA: Diagnosis present

## 2022-11-16 LAB — CBC WITH DIFFERENTIAL/PLATELET
Abs Immature Granulocytes: 0.02 10*3/uL (ref 0.00–0.07)
Basophils Absolute: 0 10*3/uL (ref 0.0–0.1)
Basophils Relative: 0 %
Eosinophils Absolute: 0.1 10*3/uL (ref 0.0–0.5)
Eosinophils Relative: 2 %
HCT: 45.4 % (ref 36.0–46.0)
Hemoglobin: 15 g/dL (ref 12.0–15.0)
Immature Granulocytes: 0 %
Lymphocytes Relative: 36 %
Lymphs Abs: 2.2 10*3/uL (ref 0.7–4.0)
MCH: 29.2 pg (ref 26.0–34.0)
MCHC: 33 g/dL (ref 30.0–36.0)
MCV: 88.5 fL (ref 80.0–100.0)
Monocytes Absolute: 0.3 10*3/uL (ref 0.1–1.0)
Monocytes Relative: 4 %
Neutro Abs: 3.5 10*3/uL (ref 1.7–7.7)
Neutrophils Relative %: 58 %
Platelets: 315 10*3/uL (ref 150–400)
RBC: 5.13 MIL/uL — ABNORMAL HIGH (ref 3.87–5.11)
RDW: 12.9 % (ref 11.5–15.5)
WBC: 6.1 10*3/uL (ref 4.0–10.5)
nRBC: 0 % (ref 0.0–0.2)

## 2022-11-16 LAB — BASIC METABOLIC PANEL
Anion gap: 9 (ref 5–15)
BUN: 12 mg/dL (ref 6–20)
CO2: 21 mmol/L — ABNORMAL LOW (ref 22–32)
Calcium: 9.1 mg/dL (ref 8.9–10.3)
Chloride: 105 mmol/L (ref 98–111)
Creatinine, Ser: 0.71 mg/dL (ref 0.44–1.00)
GFR, Estimated: >60 mL/min (ref >60)
Glucose, Bld: 145 mg/dL — ABNORMAL HIGH (ref 70–99)
Potassium: 3.9 mmol/L (ref 3.5–5.1)
Sodium: 135 mmol/L (ref 135–145)

## 2022-11-16 LAB — RESP PANEL BY RT-PCR (RSV, FLU A&B, COVID)  RVPGX2
Influenza A by PCR: NEGATIVE
Influenza B by PCR: NEGATIVE
Resp Syncytial Virus by PCR: NEGATIVE
SARS Coronavirus 2 by RT PCR: POSITIVE — AB

## 2022-11-16 MED ORDER — DIPHENHYDRAMINE HCL 50 MG/ML IJ SOLN
12.5000 mg | Freq: Once | INTRAMUSCULAR | Status: AC
  Administered 2022-11-16: 12.5 mg via INTRAVENOUS
  Filled 2022-11-16: qty 1

## 2022-11-16 MED ORDER — SODIUM CHLORIDE 0.9 % IV BOLUS
1000.0000 mL | Freq: Once | INTRAVENOUS | Status: AC
  Administered 2022-11-16: 1000 mL via INTRAVENOUS

## 2022-11-16 MED ORDER — ACETAMINOPHEN 500 MG PO TABS
1000.0000 mg | ORAL_TABLET | Freq: Once | ORAL | Status: DC
  Filled 2022-11-16: qty 2

## 2022-11-16 MED ORDER — METOCLOPRAMIDE HCL 5 MG/ML IJ SOLN
10.0000 mg | Freq: Once | INTRAMUSCULAR | Status: AC
  Administered 2022-11-16: 10 mg via INTRAVENOUS
  Filled 2022-11-16: qty 2

## 2022-11-16 MED ORDER — IBUPROFEN 800 MG PO TABS: 800.0000 mg | ORAL_TABLET | Freq: Three times a day (TID) | ORAL | 0 refills | Status: DC

## 2022-11-16 MED ORDER — SODIUM CHLORIDE 0.9 % IV SOLN: INTRAVENOUS | Status: DC

## 2022-11-16 MED ORDER — NIRMATRELVIR/RITONAVIR (PAXLOVID)TABLET: 3.0000 | ORAL_TABLET | Freq: Two times a day (BID) | ORAL | 0 refills | Status: AC

## 2022-11-16 MED ORDER — KETOROLAC TROMETHAMINE 15 MG/ML IJ SOLN
15.0000 mg | Freq: Once | INTRAMUSCULAR | Status: AC
  Administered 2022-11-16: 15 mg via INTRAVENOUS
  Filled 2022-11-16: qty 1

## 2022-11-16 NOTE — Discharge Instructions (Signed)
Recommend you take Tylenol and ibuprofen for pain control.  Return for any severe worsening of symptoms, chest pain, shortness of breath, uncontrolled headache

## 2022-11-16 NOTE — ED Provider Notes (Signed)
[place] EMERGENCY DEPARTMENT Provider Note   CSN: [id number] Arrival date & time: 11/16/22  1158     History  Chief Complaint  Patient presents with   Migraine    Raven Roberts is a 27 y.o. female.   Migraine Associated symptoms include headaches.     27 year old female presenting to the emerged department with a history of migraine headaches with a right-sided headache for the past 2 days.  She has been trying Tylenol at home without success.  She denies any nausea or vomiting.  She denies any light sensitivity or sound sensitivity.  She denies any fevers or chills, neck stiffness.  She is tolerating oral intake.  No abdominal pain, staying well-hydrated.  She denies any vision changes, vision loss, facial droop, numbness or weakness.  Her headache was not sudden in onset or maximal onset.  Home Medications Prior to Admission medications   Not on File      Allergies    Pineapple, Amoxicillin, and Penicillins    Review of Systems   Review of Systems  Neurological:  Positive for headaches.  All other systems reviewed and are negative.   Physical Exam Updated Vital Signs BP (!) 149/80 (BP Location: Left Arm)   Pulse 71   Temp 98.3 F (36.8 C) (Oral)   Resp 18   LMP 11/16/2022   SpO2 99%  Physical Exam Vitals and nursing note reviewed.  Constitutional:      General: She is not in acute distress. HENT:     Head: Normocephalic and atraumatic.  Eyes:     Conjunctiva/sclera: Conjunctivae normal.     Pupils: Pupils are equal, round, and reactive to light.  Neck:     Comments: No meningismus, range of motion intact Cardiovascular:     Rate and Rhythm: Normal rate and regular rhythm.     Heart sounds: Normal heart sounds.  Pulmonary:     Effort: Pulmonary effort is normal. No respiratory distress.     Breath sounds: Normal breath sounds.  Abdominal:     General: There is no distension.     Tenderness: There is no guarding.  Musculoskeletal:         General: No deformity or signs of injury.     Cervical back: Normal range of motion and neck supple. No rigidity or tenderness.  Skin:    Findings: No lesion or rash.  Neurological:     General: No focal deficit present.     Mental Status: She is alert. Mental status is at baseline.     Comments: MENTAL STATUS EXAM:    Orientation: Alert and oriented to person, place and time.  Memory: Cooperative, follows commands well.  Language: Speech is clear and language is normal.   CRANIAL NERVES:    CN 2 (Optic): Visual fields intact to confrontation.  CN 3,4,6 (EOM): Pupils equal and reactive to light. Full extraocular eye movement without nystagmus.  CN 5 (Trigeminal): Facial sensation is normal, no weakness of masticatory muscles.  CN 7 (Facial): No facial weakness or asymmetry.  CN 8 (Auditory): Auditory acuity grossly normal.  CN 9,10 (Glossophar): The uvula is midline, the palate elevates symmetrically.  CN 11 (spinal access): Normal sternocleidomastoid and trapezius strength.  CN 12 (Hypoglossal): The tongue is midline. No atrophy or fasciculations.Marland Kitchen   MOTOR:  Muscle Strength: 5/5RUE, 5/5LUE, 5/5RLE, 5/5LLE.   COORDINATION:   No tremor.   SENSATION:   Intact to light touch all four extremities.  ED Results / Procedures / Treatments   Labs (all labs ordered are listed, but only abnormal results are displayed) Labs Reviewed  RESP PANEL BY RT-PCR (RSV, FLU A&B, COVID)  RVPGX2 - Abnormal; Notable for the following components:      Result Value   SARS Coronavirus 2 by RT PCR POSITIVE (*)    All other components within normal limits  CBC WITH DIFFERENTIAL/PLATELET - Abnormal; Notable for the following components:   RBC 5.13 (*)    All other components within normal limits  BASIC METABOLIC PANEL - Abnormal; Notable for the following components:   CO2 21 (*)    Glucose, Bld 145 (*)    All other components within normal limits  PREGNANCY, URINE     EKG None  Radiology No results found.  Procedures Procedures    Medications Ordered in ED Medications  sodium chloride 0.9 % bolus 1,000 mL (1,000 mLs Intravenous New Bag/Given 11/16/22 1543)    And  0.9 %  sodium chloride infusion (has no administration in time range)  metoCLOPramide (REGLAN) injection 10 mg (10 mg Intravenous Given 11/16/22 1547)  diphenhydrAMINE (BENADRYL) injection 12.5 mg (12.5 mg Intravenous Given 11/16/22 1547)  ketorolac (TORADOL) 15 MG/ML injection 15 mg (15 mg Intravenous Given 11/16/22 1544)    ED Course/ Medical Decision Making/ A&P Clinical Course as of 11/16/22 1641  Mon Nov 16, 2022  1524 COVID + Has migraine.  Getting treated.  Reassess following treatment. [CC]    Clinical Course User Index [CC] Tretha Sciara, MD                           Medical Decision Making Amount and/or Complexity of Data Reviewed Labs: ordered.  Risk OTC drugs. Prescription drug management.   27 year old female presenting to the emerged department with a history of migraine headaches with a right-sided headache for the past 2 days.  She has been trying Tylenol at home without success.  She denies any nausea or vomiting.  She denies any light sensitivity or sound sensitivity.  She denies any fevers or chills, neck stiffness.  She is tolerating oral intake.  No abdominal pain, staying well-hydrated.  She denies any vision changes, vision loss, facial droop, numbness or weakness.  Her headache was not sudden in onset or maximal onset.  On arrival, patient was vitally stable, afebrile, well-appearing, hemodynamically stable, saturating well on room air.  Patient tested positive for COVID-19 by PCR testing.  She is requesting a migraine cocktail as Tylenol has been unsuccessful in managing her headache symptoms.  She has no meningismus on exam and a normal neurologic exam.  She is tolerating oral intake, well-hydrated and well-appearing, saturating well on room air  migraine cocktail for symptomatic management.  Low concern for temporal arteritis, intracranial hemorrhage, subarachnoid hemorrhage, no recent falls or trauma, normal neurologic exam, no meningismus concerning for meningitis.  Advised her of her likely diagnosis of COVID-19.  Signout to Dr. Oswald Hillock at [phone] pending reassessment.  Final Clinical Impression(s) / ED Diagnoses Final diagnoses:  Acute non intractable tension-type headache  COVID-19    Rx / DC Orders ED Discharge Orders     None         Regan Lemming, MD 11/16/22 1641

## 2022-11-16 NOTE — ED Notes (Signed)
Pt refused tylenol, she took at home as well as hydrocodone

## 2022-11-16 NOTE — ED Triage Notes (Signed)
Recurrent migraine headache x 2 days  , Hx of it 10 yrs ago. No meds. Denies NV.

## 2022-11-16 NOTE — ED Provider Notes (Signed)
Clinical Course as of 11/16/22 1826  Mon Nov 16, 2022  1524 COVID + Has migraine.  Getting treated.  Reassess following treatment. [CC]  1731 Reassess [CC]    Clinical Course User Index [CC] Tretha Sciara, MD   I was called to bedside for reassessment as patient is requesting DC.  She states that her headache had improved but she is having some residual headache. Labs all resulted and are grossly reassuring at this time.  She is otherwise ambulatory tolerating p.o. intake.  No known sick contacts. Given some residual headache remaining, I recommended further treatment and further objective evaluation.  However patient declined.  She stated that she would like to go home and does not want any further evaluation for her syndrome today.   Given patient declination for further workup and management, and patient understanding of risks of incomplete workup, patient stable for outpatient care and management with return precautions.     Tretha Sciara, MD 11/16/22 [phone]

## 2023-03-18 ENCOUNTER — Emergency Department (HOSPITAL_COMMUNITY)
Admission: EM | Admit: 2023-03-18 | Discharge: 2023-03-18 | Disposition: A | Discharge status: Home / Self Care | Payer: Medicaid Other | Attending: Emergency Medicine | Admitting: Emergency Medicine

## 2023-03-18 ENCOUNTER — Emergency Department (HOSPITAL_COMMUNITY): Payer: Medicaid Other

## 2023-03-18 DIAGNOSIS — S4991XA Unspecified injury of right shoulder and upper arm, initial encounter: Secondary | ICD-10-CM | POA: Insufficient documentation

## 2023-03-18 DIAGNOSIS — Y92009 Unspecified place in unspecified non-institutional (private) residence as the place of occurrence of the external cause: Secondary | ICD-10-CM | POA: Insufficient documentation

## 2023-03-18 DIAGNOSIS — J45909 Unspecified asthma, uncomplicated: Secondary | ICD-10-CM | POA: Diagnosis not present

## 2023-03-18 DIAGNOSIS — W010XXA Fall on same level from slipping, tripping and stumbling without subsequent striking against object, initial encounter: Secondary | ICD-10-CM | POA: Insufficient documentation

## 2023-03-18 DIAGNOSIS — M79601 Pain in right arm: Secondary | ICD-10-CM

## 2023-03-18 MED ORDER — NAPROXEN 500 MG PO TABS: 500.0000 mg | ORAL_TABLET | Freq: Two times a day (BID) | ORAL | 0 refills | Status: DC

## 2023-03-18 NOTE — ED Provider Notes (Signed)
[place] EMERGENCY DEPARTMENT AT [facility] Provider Note   CSN: [id number] Arrival date & time: 03/18/23  1150     History  Chief Complaint  Patient presents with   Arm Injury    Raven Roberts is a 27 y.o. female.  Patient presents the emergency department complaining of right upper arm pain.  Patient states that on Tuesday she fell and caught herself with her upper arm against a wall.  She denies hitting her head and denies losing consciousness.  She states she has had worsening pain in the right upper arm since the time of the fall.  Patient with past medical history significant for migraines, asthma, obesity  HPI     Home Medications Prior to Admission medications   Medication Sig Start Date End Date Taking? Authorizing Provider  naproxen (NAPROSYN) 500 MG tablet Take 1 tablet (500 mg total) by mouth 2 (two) times daily. 03/18/23  Yes Darrick Grinder, PA-C  ibuprofen (ADVIL) 800 MG tablet Take 1 tablet (800 mg total) by mouth 3 (three) times daily. 11/16/22   Glyn Ade, MD      Allergies    Pineapple, Amoxicillin, and Penicillins    Review of Systems   Review of Systems  Physical Exam Updated Vital Signs BP (!) 149/110 (BP Location: Left Arm)   Pulse 74   Temp 98.2 F (36.8 C)   Resp 20   SpO2 99%  Physical Exam Vitals and nursing note reviewed.  HENT:     Head: Normocephalic and atraumatic.  Eyes:     Conjunctiva/sclera: Conjunctivae normal.  Pulmonary:     Effort: Pulmonary effort is normal. No respiratory distress.  Musculoskeletal:        General: Signs of injury present. No swelling or deformity.     Cervical back: Normal range of motion.     Comments: Patient with pain with resisted external and internal rotation, abduction of the right upper extremity.  Patient complains of tenderness from the right [facility] joint down to the humerus.  Skin:    General: Skin is dry.  Neurological:     Mental Status: She is alert.  Psychiatric:         Speech: Speech normal.        Behavior: Behavior normal.     ED Results / Procedures / Treatments   Labs (all labs ordered are listed, but only abnormal results are displayed) Labs Reviewed - No data to display  EKG None  Radiology DG Elbow Complete Right  Result Date: 03/18/2023 CLINICAL DATA:  Injury, fall, right arm pain. EXAM: RIGHT HUMERUS - 2+ VIEW; RIGHT ELBOW - COMPLETE 3+ VIEW COMPARISON:  None Available. FINDINGS: Humerus: There is no evidence of fracture or other focal bone lesions. The cortical margins are intact. Shoulder alignment is maintained. Soft tissues are unremarkable. Elbow: No acute fracture. Normal alignment, no dislocation. The joint spaces are normal. No elbow joint effusion. No erosions or focal bone abnormality. Unremarkable soft tissues. IMPRESSION: Negative radiographs of the right humerus and elbow. Electronically Signed   By: Narda Rutherford M.D.   On: 03/18/2023 12:45   DG Humerus Right  Result Date: 03/18/2023 CLINICAL DATA:  Injury, fall, right arm pain. EXAM: RIGHT HUMERUS - 2+ VIEW; RIGHT ELBOW - COMPLETE 3+ VIEW COMPARISON:  None Available. FINDINGS: Humerus: There is no evidence of fracture or other focal bone lesions. The cortical margins are intact. Shoulder alignment is maintained. Soft tissues are unremarkable. Elbow: No acute fracture. Normal alignment,  no dislocation. The joint spaces are normal. No elbow joint effusion. No erosions or focal bone abnormality. Unremarkable soft tissues. IMPRESSION: Negative radiographs of the right humerus and elbow. Electronically Signed   By: Narda Rutherford M.D.   On: 03/18/2023 12:45    Procedures .Ortho Injury Treatment  Date/Time: 03/18/2023 1:37 PM  Performed by: Darrick Grinder, PA-C Authorized by: Darrick Grinder, PA-C   Consent:    Consent obtained:  Verbal   Consent given by:  Patient   Risks discussed:  Stiffness, vascular damage, restricted joint movement and nerve damage   Alternatives  discussed:  No treatmentInjury location: upper arm Location details: right upper arm Injury type: soft tissue Pre-procedure neurovascular assessment: neurovascularly intact Immobilization: sling Splint Applied by: Milon Dikes Post-procedure neurovascular assessment: post-procedure neurovascularly intact       Medications Ordered in ED Medications - No data to display  ED Course/ Medical Decision Making/ A&P                             Medical Decision Making Amount and/or Complexity of Data Reviewed Radiology: ordered.   Patient presents to the emergency department with a chief complaint of right upper extremity pain.  Differential diagnosis includes but is not limited to fracture, dislocation, soft tissue injury, ligamentous injury, and others  I ordered and interpreted imaging including plain films of the right humerus and elbow.  X-rays were negative for acute fracture or dislocation.  I agree with the radiologist findings  The patient was placed in a sling for support.  Patient had no acute fracture or dislocation on imaging.  Question possible rotator cuff injury based on patient's pain with resisted range of motion but unable to diagnose based on plain x-rays.  May also be inflammation with no underlying injury.  Plan to discharge home with sling, recommendations for rest, and NSAIDs.  Patient will use ice as needed.  Patient to follow-up with orthopedics for further evaluation and management.  Discharge home.        Final Clinical Impression(s) / ED Diagnoses Final diagnoses:  Right arm pain    Rx / DC Orders ED Discharge Orders          Ordered    naproxen (NAPROSYN) 500 MG tablet  2 times daily        03/18/23 1339              Pamala Duffel 03/18/23 1340    Virgina Norfolk, DO 03/18/23 1549

## 2023-03-18 NOTE — ED Triage Notes (Signed)
Pt states that two days ago she tripped in her house and caught herself on the wall, injuring her R elbow and humerus. Pt denies shoulder, wrist, and forearm pain.

## 2023-03-18 NOTE — Discharge Instructions (Addendum)
You were evaluated today for right arm pain.  Images were reassuring for no signs of fracture or dislocation.  Please use a sling to rest the right upper extremity.  If you find it cumbersome or need to remove for hygiene you may take it off.  Please take the prescribed Naprosyn.  While taking the Naprosyn do not take other NSAID medications such as ibuprofen.  Please follow-up with orthopedics for further evaluation and management.

## 2023-07-09 ENCOUNTER — Ambulatory Visit (HOSPITAL_COMMUNITY)
Admission: EM | Admit: 2023-07-09 | Discharge: 2023-07-09 | Disposition: A | Discharge status: Home / Self Care | Payer: Medicaid Other | Attending: Family Medicine | Admitting: Family Medicine

## 2023-07-09 ENCOUNTER — Other Ambulatory Visit: Payer: Self-pay

## 2023-07-09 ENCOUNTER — Encounter (HOSPITAL_COMMUNITY): Payer: Self-pay | Admitting: *Deleted

## 2023-07-09 DIAGNOSIS — J069 Acute upper respiratory infection, unspecified: Secondary | ICD-10-CM

## 2023-07-09 DIAGNOSIS — Z1152 Encounter for screening for COVID-19: Secondary | ICD-10-CM

## 2023-07-09 DIAGNOSIS — G510 Bell's palsy: Secondary | ICD-10-CM | POA: Diagnosis not present

## 2023-07-09 LAB — SARS CORONAVIRUS 2 (TAT 6-24 HRS): SARS Coronavirus 2: NEGATIVE

## 2023-07-09 MED ORDER — PREDNISONE 50 MG PO TABS: 50.0000 mg | ORAL_TABLET | Freq: Every day | ORAL | 0 refills | Status: AC

## 2023-07-09 NOTE — ED Triage Notes (Signed)
Pt reports at 0745 she noticed facial droop. Pt was trying to smoke a black and mild and noticed she could not get lips around the black and mid tight. Around 2330 Pt noticed Rt eye is smaller than Lt eye

## 2023-07-09 NOTE — Discharge Instructions (Addendum)
You were seen today for possible Bells Palsy.  I am treating you with prednisone 50mg  daily x 5 days for this treatment.  We are also swabbing your for covid.  This will be resulted tomorrow and you will see these results on mychart.  Please follow up if not improving or worsening. You may use over the counter medications for your upper respiratory symptoms.

## 2023-07-09 NOTE — ED Provider Notes (Signed)
[facility]    CSN: [id number] Arrival date & time: 07/09/23  0802      History   Chief Complaint Chief Complaint  Patient presents with   Facial Droop    HPI EMANUELLA SCHLEIMER is a 27 y.o. female.   Patient is here for facial droop.  Last night she noted a left facial droop while trying to smoke a cigarette.  Also noted the left eye would not close completely.  She feels like everything is "going to the right".   She has had some URI symptoms x 5 days.  Taste is off as well.  She is worried that covid could cause Bells Palsy, and would like a covid test.  No exposure to covid per se.        Past Medical History:  Diagnosis Date   Asthma    no inhaler, no current problems   Migraines    otc med prn   Rectal pain, chronic    S/P tubal ligation 09/08/2019    Patient Active Problem List   Diagnosis Date Noted   Class 3 severe obesity due to excess calories without serious comorbidity with body mass index (BMI) of 50.0 to 59.9 in adult (HCC) 01/07/2022   Elevated BP without diagnosis of hypertension 01/07/2022   S/P tubal ligation 09/08/2019   SVD (spontaneous vaginal delivery) 08/04/2019   Preeclampsia, third trimester 08/03/2019   Admission for tubal ligation 07/25/2018   Penicillin allergy 02/28/2015   Rubella non-immune status, antepartum 02/28/2015   Maternal morbid obesity, antepartum (HCC)    Migraine without aura and without status migrainosus, not intractable 12/11/2014    Past Surgical History:  Procedure Laterality Date   DILATION AND CURETTAGE OF UTERUS     DILATION AND EVACUATION N/A 04/18/2018   Procedure: DILATATION AND EVACUATION WITH ULTRASOUND GUIDANCE;  Surgeon: Sherian Rein, MD;  Location: [facility];  Service: Gynecology;  Laterality: N/A;   LAPAROSCOPIC BILATERAL SALPINGECTOMY Bilateral 09/08/2019   Procedure: LAPAROSCOPIC BILATERAL SALPINGECTOMY;  Surgeon: Edwinna Areola, DO;  Location: [facility];  Service: Gynecology;   Laterality: Bilateral;   TOOTH EXTRACTION     w/anesthesia   TUBAL LIGATION      OB History     Gravida  5   Para  4   Term  4   Preterm  0   AB  1   Living  4      SAB  1   IAB  0   Ectopic  0   Multiple  0   Live Births  4            Home Medications    Prior to Admission medications   Medication Sig Start Date End Date Taking? Authorizing Provider  ibuprofen (ADVIL) 800 MG tablet Take 1 tablet (800 mg total) by mouth 3 (three) times daily. 11/16/22   Glyn Ade, MD  naproxen (NAPROSYN) 500 MG tablet Take 1 tablet (500 mg total) by mouth 2 (two) times daily. 03/18/23   Darrick Grinder, PA-C    Family History Family History  Problem Relation Age of Onset   Hypertension Mother    Hypertension Father    Diabetes Maternal Aunt    Hypertension Maternal Aunt    Diabetes Maternal Uncle    Cancer Maternal Grandmother    Hypertension Maternal Grandmother    Cancer Maternal Grandfather        colon   Hypertension Maternal Grandfather    Stroke Maternal Grandfather  Social History Social History   Tobacco Use   Smoking status: Former    Types: Cigars   Smokeless tobacco: Never   Tobacco comments:    Black and mild 3 cigars/day  Vaping Use   Vaping status: Every Day   Substances: Nicotine, Flavoring  Substance Use Topics   Alcohol use: Yes    Alcohol/week: 2.0 standard drinks of alcohol    Types: 2 Shots of liquor per week    Comment: occ   Drug use: Yes    Types: Marijuana     Allergies   Pineapple, Amoxicillin, and Penicillins   Review of Systems Review of Systems  Constitutional: Negative.   HENT:  Positive for congestion and rhinorrhea.   Respiratory:  Positive for cough.   Cardiovascular: Negative.   Gastrointestinal: Negative.   Musculoskeletal: Negative.   Neurological:  Positive for facial asymmetry.     Physical Exam Triage Vital Signs ED Triage Vitals  Encounter Vitals Group     BP 07/09/23 0822 (!) 135/90      Systolic BP Percentile --      Diastolic BP Percentile --      Pulse Rate 07/09/23 0822 65     Resp 07/09/23 0822 20     Temp 07/09/23 0822 97.9 F (36.6 C)     Temp src --      SpO2 07/09/23 0822 98 %     Weight --      Height --      Head Circumference --      Peak Flow --      Pain Score 07/09/23 0823 0     Pain Loc --      Pain Education --      Exclude from Growth Chart --    No data found.  Updated Vital Signs BP (!) 135/90   Pulse 65   Temp 97.9 F (36.6 C)   Resp 20   LMP 07/03/2023 (Approximate)   SpO2 98%   Breastfeeding No   Visual Acuity Right Eye Distance:   Left Eye Distance:   Bilateral Distance:    Right Eye Near:   Left Eye Near:    Bilateral Near:     Physical Exam Constitutional:      Appearance: Normal appearance.  Cardiovascular:     Rate and Rhythm: Normal rate and regular rhythm.  Pulmonary:     Effort: Pulmonary effort is normal.     Breath sounds: Normal breath sounds.  Neurological:     Mental Status: She is alert.     Comments: When smiling the left corner of the mouth does not raise as much;  the left cheek is not as full. Tongue is midline;  eyelids closing normally  Psychiatric:        Mood and Affect: Mood normal.      UC Treatments / Results  Labs (all labs ordered are listed, but only abnormal results are displayed) Labs Reviewed  SARS CORONAVIRUS 2 (TAT 6-24 HRS)    EKG   Radiology No results found.  Procedures Procedures (including critical care time)  Medications Ordered in UC Medications - No data to display  Initial Impression / Assessment and Plan / UC Course  I have reviewed the triage vital signs and the nursing notes.  Pertinent labs & imaging results that were available during my care of the patient were reviewed by me and considered in my medical decision making (see chart for details).   Final Clinical Impressions(s) /  UC Diagnoses   Final diagnoses:  Encounter for screening for  COVID-19  Acute upper respiratory infection  Bell's palsy     Discharge Instructions      You were seen today for possible Bells Palsy.  I am treating you with prednisone 50mg  daily x 5 days for this treatment.  We are also swabbing your for covid.  This will be resulted tomorrow and you will see these results on mychart.  Please follow up if not improving or worsening. You may use over the counter medications for your upper respiratory symptoms.     ED Prescriptions     Medication Sig Dispense Auth. Provider   predniSONE (DELTASONE) 50 MG tablet Take 1 tablet (50 mg total) by mouth daily for 5 days. 5 tablet Jannifer Franklin, MD      PDMP not reviewed this encounter.   Jannifer Franklin, MD 07/09/23 [phone]

## 2024-05-26 ENCOUNTER — Encounter: Payer: Self-pay | Admitting: Advanced Practice Midwife
